# Patient Record
Sex: Female | Born: 1950
Health system: Southern US, Community
[De-identification: ages and names within clinical notes are randomized; demographics above are authoritative.]

## PROBLEM LIST (undated history)

## (undated) DIAGNOSIS — N189 Chronic kidney disease, unspecified: Secondary | ICD-10-CM

## (undated) DIAGNOSIS — R011 Cardiac murmur, unspecified: Secondary | ICD-10-CM

## (undated) DIAGNOSIS — R7303 Prediabetes: Secondary | ICD-10-CM

## (undated) DIAGNOSIS — F32A Depression, unspecified: Secondary | ICD-10-CM

## (undated) DIAGNOSIS — I10 Essential (primary) hypertension: Secondary | ICD-10-CM

## (undated) DIAGNOSIS — Z87442 Personal history of urinary calculi: Secondary | ICD-10-CM

## (undated) DIAGNOSIS — M1990 Unspecified osteoarthritis, unspecified site: Secondary | ICD-10-CM

## (undated) DIAGNOSIS — F419 Anxiety disorder, unspecified: Secondary | ICD-10-CM

## (undated) DIAGNOSIS — G4730 Sleep apnea, unspecified: Secondary | ICD-10-CM

## (undated) HISTORY — PX: OTHER SURGICAL HISTORY: SHX169

## (undated) HISTORY — PX: INGUINAL HERNIA REPAIR: SUR1180

## (undated) HISTORY — PX: LAPAROSCOPIC CHOLECYSTECTOMY: SUR755

---

## 2000-08-24 ENCOUNTER — Other Ambulatory Visit: Admission: RE | Admit: 2000-08-24 | Discharge: 2000-08-24 | Payer: Self-pay | Admitting: Family Medicine

## 2003-08-20 ENCOUNTER — Inpatient Hospital Stay (HOSPITAL_COMMUNITY): Admission: RE | Admit: 2003-08-20 | Discharge: 2003-08-24 | Payer: Self-pay | Admitting: Orthopedic Surgery

## 2006-09-21 ENCOUNTER — Ambulatory Visit (HOSPITAL_COMMUNITY): Admission: RE | Admit: 2006-09-21 | Discharge: 2006-09-21 | Payer: Self-pay | Admitting: Orthopedic Surgery

## 2008-08-01 IMAGING — CR DG CHEST 2V
2 series · 2 of 2 positions shown · non-contrast
Comparison: 08/10/03.

CLINICAL DATA: Preop for knee surgery. 
 CHEST - 2 VIEW:

[view not recorded (1 of 2)]
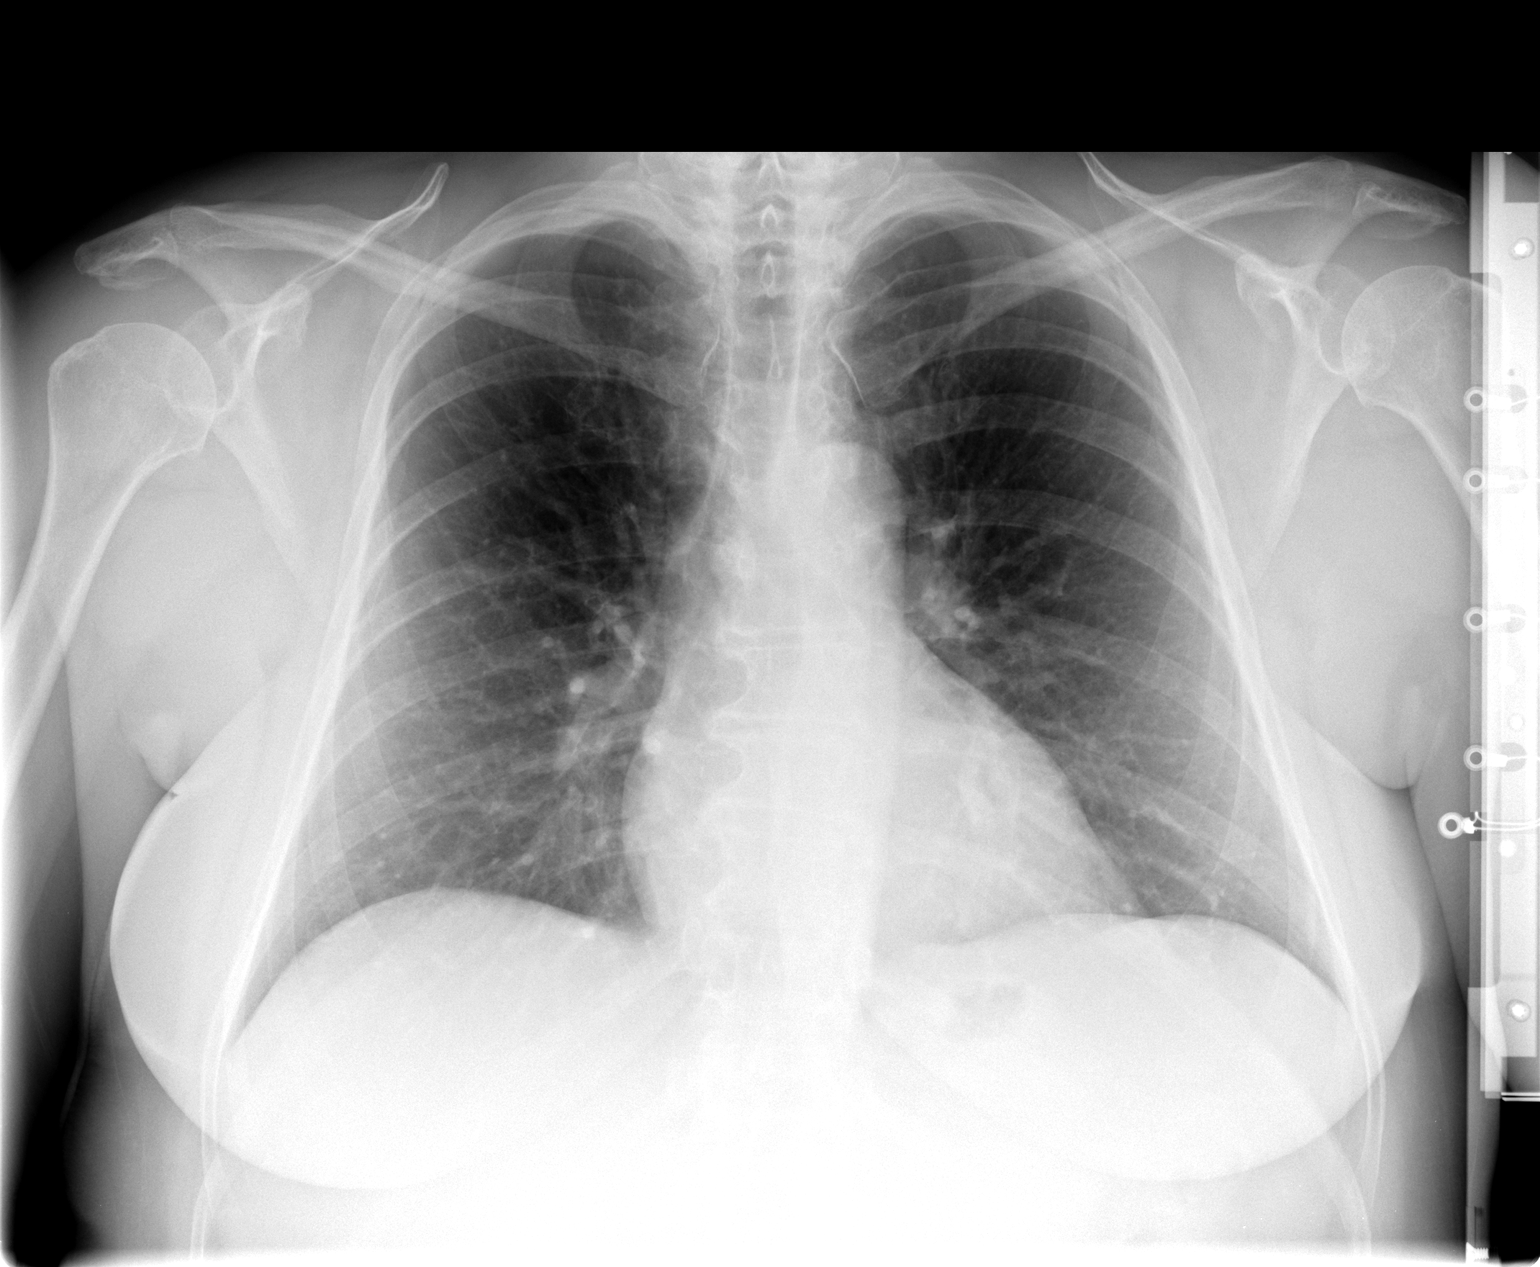

[view not recorded (2 of 2)]
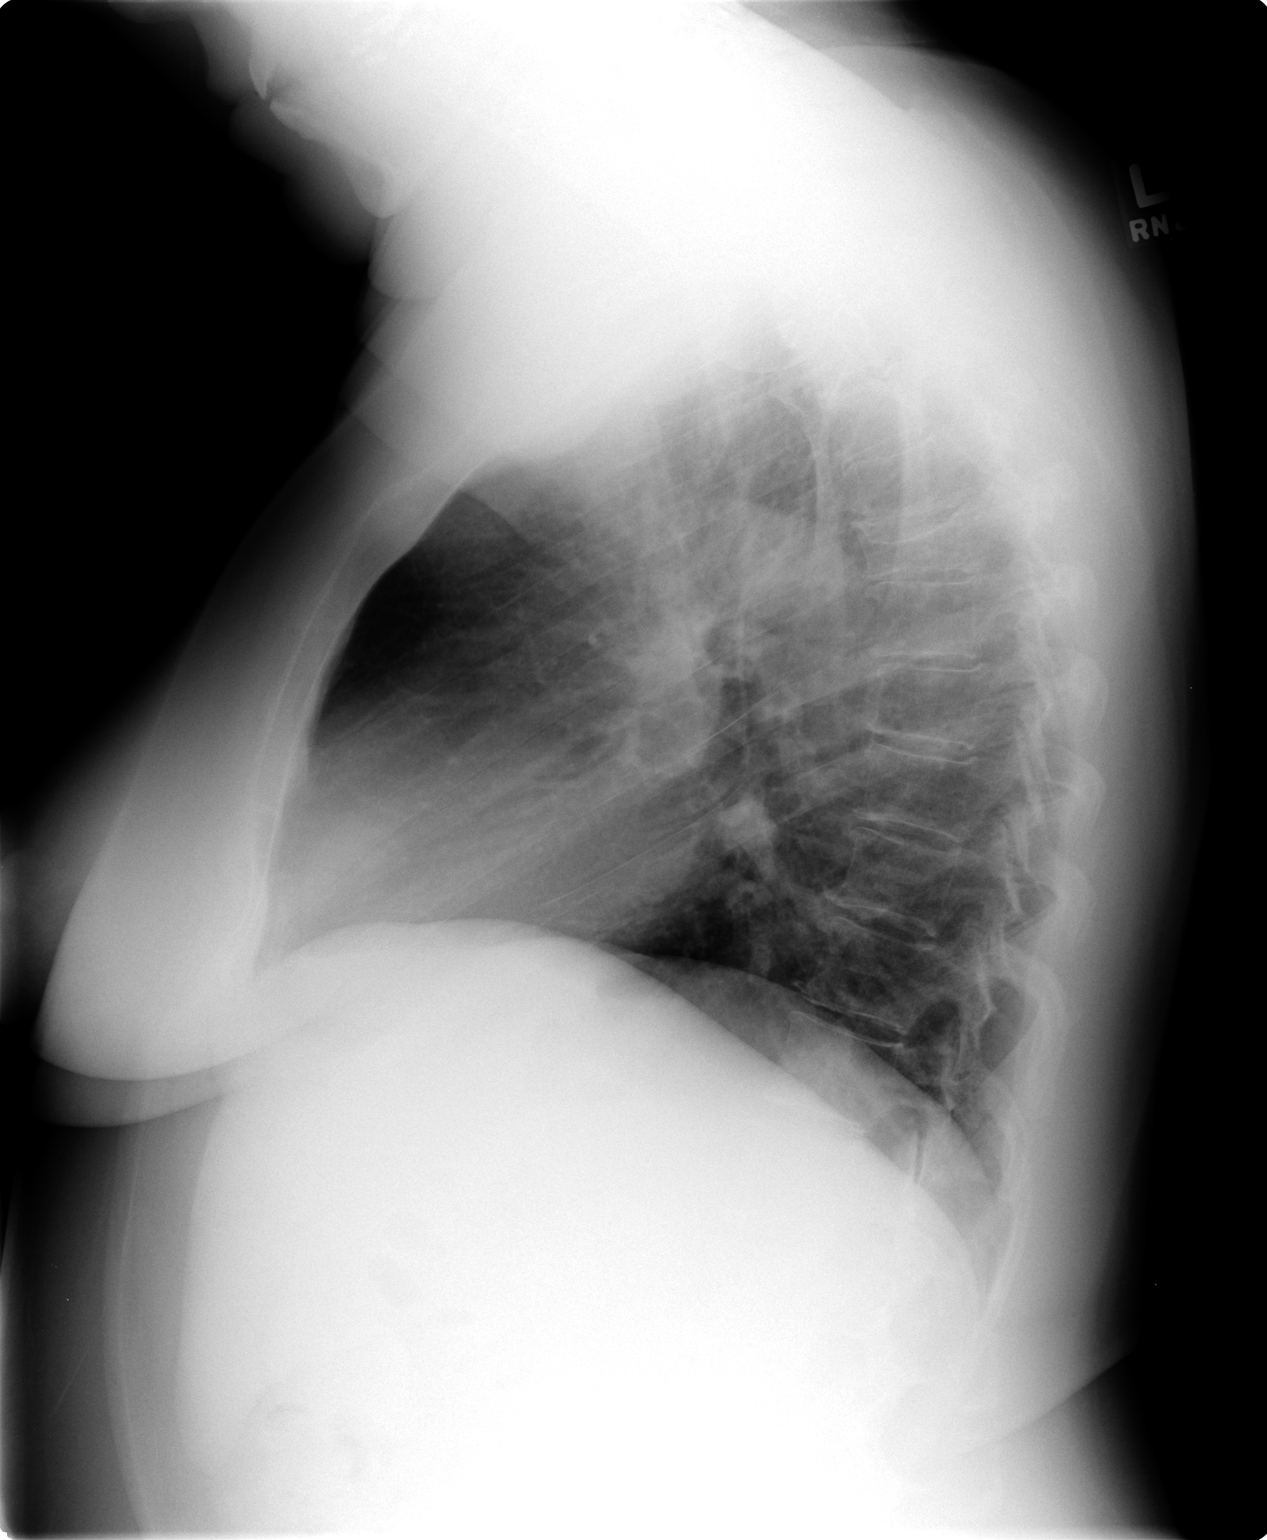

[2 of 2 positions shown; findings below may reference images not displayed]

FINDINGS: Heart size within normal limits.  Mild peribronchial thickening.  Lungs clear.  Stable T6 compression fracture.
IMPRESSION: 1.  No active cardiopulmonary disease.  
 2.   Old T6 compression fracture - stable.

## 2010-07-22 NOTE — Op Note (Signed)
NAME:  Kimberly Andrews, Kimberly Andrews NO.:  [id number]   MEDICAL RECORD NO.:  [id number]          PATIENT TYPE:  AMB   LOCATION:  DAY                          FACILITY:  [facility]   PHYSICIAN:  Marlowe Kays, M.D.  DATE OF BIRTH:  [birth year]   DATE OF PROCEDURE:  09/21/2006  DATE OF DISCHARGE:                               OPERATIVE REPORT   PREOPERATIVE DIAGNOSES:  1. Osteoarthritis.  2. Torn medial and lateral menisci right knee.   POSTOPERATIVE DIAGNOSES:  1. Osteoarthritis.  2. Torn medial and lateral menisci right knee.   OPERATION:  1. Right knee arthroscopy, with partial medial and lateral      meniscectomies.  2. Debridement of medial and lateral femoral condyles.   SURGEON:  Marlowe Kays, M.D.   ASSISTANT:  Nurse.   ANESTHESIA:  General.   PATHOLOGY AND INDICATIONS FOR PROCEDURE:  She has had a knee replacement  on the left.  Has tricompartmental degenerative changes which were not  felt to be severe on plain x-rays in the right knee, but an MRI has  demonstrated torn menisci.  Consequently, she is here today for the  above-mentioned arthroscopic procedure.   PROCEDURE:  Prophylactic antibiotics, satisfactory general anesthesia,  time-out performed.  Ace wrap and knee support to left lower extremity.  Thigh tourniquet to right leg, with the leg Esmarched out non sterilely.  Thigh stabilizer applied.  Right knee was prepped with DuraPrep from  stabilizer to ankle and draped in a sterile field.  Superomedial saline  inflow.  First through an anteromedial portal, the lateral compartment  of the knee joint was evaluated.  A good bit of synovitis was resected  with a 3.5 shaver.  The entire inner border of the lateral meniscus was  torn,  and there was also some partial detachment of articular cartilage  of the lateral femoral condyle.  I debrided down the lateral femoral  condyle and shaved down the inner portion of the lateral meniscus until  smooth with a  3.5 shaver.  Looking in the lateral gutter and  suprapatellar area, there was some wear of the patella, but nothing that  required shaving.  We then reversed portals, looking laterally in the  medial joint.  A good bit of synovitis once again I resected.  She had  grade 2-3/4 chondromalacia also of the medial femoral condyle, which I  shaved down until smooth.  Posteriorly, she had what initially looked  like a radial type tear of the intercondylar area of the medial  meniscus, but on further probing basically she had close to a bucket-  handle tear, necessitating removal of most of the entire posterior rim  of the medial meniscus back to the curve, with the remaining meniscus  being stable.  This was done with a variety of instruments.  Final  pictures were taken.  The knee was irrigated until clear.  The two  anterior portals were closed with 4-0 nylon.  I injected 20 cc of 0.5%  Marcaine with adrenalin and 4 mg of  morphine through the inflow apparatus, which I removed, and this portal  was closed  4-0 nylon as well.  Betadine, Adaptic, and dry sterile  dressing were applied.  Tourniquet was released.  She tolerated the  procedure well was taken to the recovery room in satisfactory condition,  with no known complications.           ______________________________  Marlowe Kays, M.D.     JA/MEDQ  D:  09/21/2006  T:  09/22/2006  Job:  [id number]

## 2010-07-25 NOTE — Op Note (Signed)
NAME:  Kimberly Andrews, Kimberly Andrews                       ACCOUNT NO.:  [id number]   MEDICAL RECORD NO.:  [id number]                   PATIENT TYPE:  INP   LOCATION:  0474                                 FACILITY:  [facility]   PHYSICIAN:  Marlowe Kays, M.D.               DATE OF BIRTH:  Aug 28, 1950   DATE OF PROCEDURE:  08/20/2003  DATE OF DISCHARGE:                                 OPERATIVE REPORT   PREOPERATIVE DIAGNOSIS:  Osteoarthritis, left knee.   POSTOPERATIVE DIAGNOSIS:  Osteoarthritis, left knee.   OPERATION:  Osteonics total knee replacement, left.   SURGEON:  Illene Labrador. [place], M.D.   ASSISTANT:  Worthy Rancher, M.D.   ANESTHESIA:  General.   PATHOLOGY AND JUSTIFICATION FOR PROCEDURE:  She had advanced patellofemoral  and medial compartment arthritis.   PROCEDURE:  Prophylactic antibiotics, satisfactory general anesthesia, Foley  catheter inserted, pneumatic tourniquet, lateral hip positioner, Surefoot.  Her left leg was prepped with DuraPrep from tourniquet to ankle and draped  in a sterile field.  Ioban employed.  The knee was esmarched out sterilely.  Vertical midline incision down to the patellar mechanism with median  parapatellar incision to open the joint.  The PCA __________ and medial  collateral ligament were undermined off the proximal tibia, and I then  released the posterior capsule all the way back medially.  Median  parapatellar incision was used to open the knee joint.  Osteophytes were  removed from around the femur and the patella.  Anterior portions of both  meniscus, ACL and PCL were sacrificed.  The patellar mechanism was freed up,  the patella everted, and the knee flexed.  A 516th inch drill hole was then  made in the distal femur followed by the canal finder and the axis liner set  at 5 degrees for the left knee.  A 10 mm distal femoral cut was made.  Incised the femur initially to 7 but subsequently found that a 5 was the  more appropriate size.  Scribe  lines were placed, and I used the distal  femoral cutting jig to make anterior and posterior cuts and posterior and  anterior chamferings.  Then went to the tibia, where I made a leveling cut  and measured it as a size 5.  Tibia base plate was placed as part of the  sizing.  I then made my initial drill hole into the proximal tibia which was  enlarged with the step-cut drill followed by the canal finder, then used  medullary rod and external cutting jig set for 0-degree posterior cut and a  4 mm cut off the medial tibial plateau.  After this cut was made, remnants  of the menisci were removed.  Then went back to the femur where I placed the  jig for making the patellar groove, followed by the slot for the post and  plate and then placed the lamina spreader to remove remnants of bone and  soft tissue posteriorly.  While the knee was in extension, I then sized the  patella to 26 and used the 10 mm recessed cutting jig to make the 10 mm  recess cut, followed by the guide for the 3 fixation holes for the patellar  button.  Trial button was placed, and excess bone was trimmed from around  the button.  We then went through a trial reduction with the components and  found the 10 or 12 mm spacer would be appropriate.  We used the external  rod, splitting the bimalleolar distance for the appropriate position of the  baseplate.  Then based on these markings, I attached the baseplate to the  proximal tibia with 3 pins, and we used the tripod apparatus to ream for the  tibial stem up to a 5 cemented.  The knee was then waterpicked while the  methyl methacrylate was being mixed.  The 3 components were then  individually glued in, starting first with the tibia which was tightly  impacted with excess methyl methacrylate being removed followed by the  femoral component in the same fashion with the 10 mm spacer, held the knee  in extension while we glued in the patella which we then held with the  patellar  holding clamp.  The wound was __________ hardened.  We removed  excess methyl methacrylate from around the components and used the 12 mm  posterior stabilized spacer which was the appropriate size for extension  which was to neutral and with good medial and lateral stability.  She also  had excellent flexion with stability.  Accordingly, we went ahead and placed  the final tibial spacer, 12 mm, and closed the wound over a Hemovac with  interrupted #1 Vicryl in two layers and the quadriceps mechanism and  distally in the synovium and capsule, 2-0 Vicryl in the subcutaneous tissue  and staples in the skin.  Betadine, Adaptic dry sterile dressing were  applied.  Tourniquet was released with a little less than 2 hours of  tourniquet time having lapsed.  She tolerated the procedure well and was  taken to the recovery room in satisfactory condition with no known  complications.  No blood loss, no blood replacement.                                               Marlowe Kays, M.D.    JA/MEDQ  D:  08/20/2003  T:  08/20/2003  Job:  [id number]

## 2010-07-25 NOTE — Discharge Summary (Signed)
NAME:  Kimberly Andrews, Kimberly Andrews                       ACCOUNT NO.:  [id number]   MEDICAL RECORD NO.:  [id number]                   PATIENT TYPE:  INP   LOCATION:  0474                                 FACILITY:  [facility]   PHYSICIAN:  Marlowe Kays, M.D.               DATE OF BIRTH:  [birth year]   DATE OF ADMISSION:  08/20/2003  DATE OF DISCHARGE:  08/24/2003                                 DISCHARGE SUMMARY   ADMISSION DIAGNOSES:  1. Osteoarthritis left knee.  2. Sleep apnea.  3. Hypertension.  4. Hyperlipidemia.   DISCHARGE DIAGNOSES:  1. Osteoarthritis left knee.  2. Sleep apnea.  3. Hypertension.  4. Hyperlipidemia.  5. Postoperative anemia.  6. Mild urinary tract infection.   OPERATION:  On Andrews 13, 2005 the patient underwent Osteonics total knee  replacement arthroplasty to the left knee with all three components  cemented, Dr. Ranee Gosselin assistant.   CONSULTATIONS:  None.   BRIEF HISTORY:  A 60 year old lady seen for [place] for continuing progressive  problems concerning bilateral knee pains.  The majority of her discomfort  was on the left.  She had been a very active lady who enjoyed activities  such as walking as well as her work.  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  Anti-inflammatories unfortunately did not help her and x-  rays had shown deterioration of the joints.  Bone-on-bone contact was seen  in the left knee on the medial aspect.  Felt the patient would benefit from  total knee replacement arthroplasty.  After all the complications of  surgery, pre and postoperative course was explained to the patient and she  agreed to go ahead with total knee replacement arthroplasty left knee.  She  was cleared preoperatively by Dr. Sudie Bailey for the surgery.   COURSE IN THE HOSPITAL:  The patient tolerated the surgical procedure quite  well.  She continued to have a positive  outlook concerning her postoperative  knee protocol and rehabilitation.  She tolerated CPM quite well achieving 50-  60 degrees flexion.  She had full extension.  Wound remained dry throughout  her hospitalization as well as her neurovascular status remaining intact to  the left lower extremity and the calf remained soft and repeated Hoffman  tests were negative.   The patient used her CPAP machine postoperatively, had no respiratory  complications.  She was placed on incentive spirometry used q.1 h. while  awake and for maintenance of lung volume and to prevent atelectasis.  The  patient ran a low-grade temperature throughout her hospitalization.  It was  not coming from the wound as it was clean, dry, and no drainage.  When the  Foley was discontinued, we did a urinalysis.  It was noted that she had  moderate leukocytes and a few white cells.  Empirically, we felt that she  did have a mild urinary tract infection.  We treated her with Septra DS as  an outpatient.   The day of discharge was eager to go home.  [facility] had been set  up and the patient was progressing so nicely it was felt she could be  maintained on home environments and arrangements made for discharge.   The patient was placed on Lovenox early on and then Coumadin for the  prevention of DVT postoperatively.   LABORATORY DATA:  Her hemoglobin and hematocrit dropped postoperatively  which is not uncommon to 9.8.  Preoperatively it was 14.  It was felt she  had a mild postoperative anemia which she was on Trinsicon t.i.d. as an iron  supplement.  She had no symptomatology concerning this anemia.  No  dizziness, no vertigo.  Laboratory values in the hospital hematologically  showed a CBC on admission which completely was within normal limits.  Final  hemoglobin was 9.8, hematocrit was 28.5.  Blood chemistries also were  normal.  Preoperative urinalysis showed a mild urinary tract infection as  well as the one  repeated on Andrews 16, 2005.  Chest x-ray showed no acute  chest disease.  A T6 compression fracture with 50% height lost anteriorly  was noted.  Knee x-ray showed mildly severe left knee tricompartmental  arthritis.  Electrocardiogram normal sinus rhythm with minimal voltage  criteria for LDH, maybe normal variant.   CONDITION ON DISCHARGE:  Improved, stable.   PLAN:  The patient is discharged to her home in the care of her family to  continue with her home rehabilitation program under the direction of  [place].  Return to see Dr. Simonne Come in 2 weeks after the date of surgery.  Continue with home medications and diet.  Use dry dressing to the knee  p.r.n., weight bear as tolerated, and ice p.r.n.  She may shower 4 days  after surgery.  She is given a prescription for Coumadin to be managed by  [facility] Pharmacist, Robaxin 500 mg as a muscle relaxant, Tylox for  discomfort, Trinsicon as an iron replacement, and Septra DS one b.i.d. x3  days for her mild urinary tract infection.  Told her at bedside she needs to  follow with Dr. Sudie Bailey should she have any urinary symptoms or if her  temperature continues.  Use her incentive spirometer at home and she is to  call the office should she have any problems or questions.     Kimberly Andrews.                 Marlowe Kays, M.D.    DLU/MEDQ  D:  08/24/2003  T:  08/25/2003  Job:  [id number]   cc:   Philemon Kingdom  [address]  [place]  [place] [address]  Fax: [phone]

## 2010-07-25 NOTE — H&P (Signed)
NAME:  Keturah, Yerby NO.:  [id number]   MEDICAL RECORD NO.:  [id number]                  PATIENT TYPE:   LOCATION:                                       FACILITY:   PHYSICIAN:  Marlowe Kays, M.D.               DATE OF BIRTH:  1951/02/08   DATE OF ADMISSION:  08/20/2003  DATE OF DISCHARGE:                                HISTORY & PHYSICAL   CHIEF COMPLAINT:  Pain in my left knee.   HISTORY OF PRESENT ILLNESS:  This is a 60 year old white female seen by Dr.  Simonne Come for bilateral knee pain, more so on the left than the right.  She  is referred here through the courtesy of Dr. Sudie Bailey in [place] at  [facility]. When seen in February of this by Dr. Simonne Come  they decided that with her findings she would benefit from a total knee  arthroplasty on the left.   X-rays have shown bone-on-bone contact with the left knee seen more so  medially. Patellar spurring is seen as well. She has pain with range of  motion on flexion and extension of the knee.  Due to these positive  findings, the fact that she is very active young lady, it is felt that she  would benefit from surgical intervention. She is being admitted for total  knee replacement arthroplasty of the left knee. She tells me that she will  be seen by Dr. Sudie Bailey in the next few days for preoperative clearance.   PAST MEDICAL HISTORY:  This lady has been in relatively good health  throughout her lifetime. Her only surgery has been an inguinal hernia repair  on the right in July 2002.  She does have hypertension and sleep apnea.   CURRENT MEDICATIONS:  1. Toprol XL 100 mg one daily.  2. Triamterene/HCTZ 75/50 one daily.  3. Diovan 320 mg one daily.  4. Lipitor 20 mg one daily.  5. Aspirin 81 mg daily (will stop before surgery).   ALLERGIES:  PENICILLIN which causes a rash.   Dr. Philemon Kingdom is her family physician in [place].   FAMILY HISTORY:  Positive for  heart disease. Her father is deceased. Mother  alive at 59. They also have hypertension. Diabetes in her grandmother. Lung  cancer in her aunt and stroke in another.   SOCIAL HISTORY:  The patient is married, she is a Midwife. She  has no intake of tobacco products. She has an occasional glass of wine. She  has four children. They live in a one-story house. Her husband and two adult  children will be her major caregivers after surgery.   REVIEW OF SYSTEMS:  CNS: No seizure disorder, paralysis, numbness, or double  vision. RESPIRATORY:  No productive cough, no hemoptysis, and no shortness  of breath. CARDIOVASCULAR: No chest pain, no angina, no orthopnea.  GASTROINTESTINAL: No nausea, vomiting, melena, or bloody stool.  GENITOURINARY: No  dysuria, hematuria, or discharge. MUSCULOSKELETAL:  Primarily in present illness.   PHYSICAL EXAMINATION:  VITAL SIGNS: Pulse 72, respirations 12, blood  pressure 138/82.  GENERAL: Alert, cooperative, and friendly 60 year old white female.  HEENT:  Normocephalic. PERRLA. Oropharynx is clear. EOMs are intact.  CHEST: Clear to auscultation. No rhonchi, no wheezes.  HEART: A grade 4/6 holosystolic murmur heard best at the right sternal  border.  ABDOMEN: Soft, nontender. Liver and spleen not felt.  RECTAL/BREASTS/GENITALIA: Not done; not pertinent to the present illness.  EXTREMITIES: The left knee as in present illness above.   ADMISSION DIAGNOSES:  1. Osteoarthritis, left knee.  2. Sleep apnea.  3. Hypertension.  4. Hyperlipidemia.   PLAN:  The patient will be admitted for total knee replacement arthroplasty  to the left knee. Dr. Simonne Come has discussed with her the possible  complications concerning this surgery.  In all probability, she will be able  to go home after regular hospitalization with [facility] supplying her home  health or she may have to go to [facility] depending on her level of  activity.     Dooley L. Cherlynn June.                 Marlowe Kays, M.D.    DLU/MEDQ  D:  08/02/2003  T:  08/02/2003  Job:  [id number]   cc:   Philemon Kingdom  [address]  [place]  [place] [address]  Fax: [phone]

## 2010-07-28 ENCOUNTER — Other Ambulatory Visit: Payer: Self-pay | Admitting: Orthopedic Surgery

## 2010-07-28 DIAGNOSIS — M549 Dorsalgia, unspecified: Secondary | ICD-10-CM

## 2010-07-28 DIAGNOSIS — M4310 Spondylolisthesis, site unspecified: Secondary | ICD-10-CM

## 2010-08-08 ENCOUNTER — Ambulatory Visit
Admission: RE | Admit: 2010-08-08 | Discharge: 2010-08-08 | Disposition: A | Discharge status: Home / Self Care | Payer: BC Managed Care – PPO | Source: Ambulatory Visit | Attending: Orthopedic Surgery | Admitting: Orthopedic Surgery

## 2010-08-08 DIAGNOSIS — M4310 Spondylolisthesis, site unspecified: Secondary | ICD-10-CM

## 2010-08-08 DIAGNOSIS — M549 Dorsalgia, unspecified: Secondary | ICD-10-CM

## 2010-09-03 ENCOUNTER — Other Ambulatory Visit: Payer: Self-pay | Admitting: Orthopedic Surgery

## 2010-09-03 ENCOUNTER — Other Ambulatory Visit (HOSPITAL_COMMUNITY): Payer: Self-pay | Admitting: Orthopedic Surgery

## 2010-09-03 ENCOUNTER — Encounter (HOSPITAL_COMMUNITY): Payer: BC Managed Care – PPO

## 2010-09-03 ENCOUNTER — Ambulatory Visit (HOSPITAL_COMMUNITY)
Admission: RE | Admit: 2010-09-03 | Discharge: 2010-09-03 | Disposition: A | Discharge status: Home / Self Care | Payer: BC Managed Care – PPO | Source: Ambulatory Visit | Attending: Orthopedic Surgery | Admitting: Orthopedic Surgery

## 2010-09-03 DIAGNOSIS — Z79899 Other long term (current) drug therapy: Secondary | ICD-10-CM | POA: Insufficient documentation

## 2010-09-03 DIAGNOSIS — G4733 Obstructive sleep apnea (adult) (pediatric): Secondary | ICD-10-CM | POA: Insufficient documentation

## 2010-09-03 DIAGNOSIS — M4800 Spinal stenosis, site unspecified: Secondary | ICD-10-CM

## 2010-09-03 DIAGNOSIS — I10 Essential (primary) hypertension: Secondary | ICD-10-CM | POA: Insufficient documentation

## 2010-09-03 DIAGNOSIS — Z01818 Encounter for other preprocedural examination: Secondary | ICD-10-CM | POA: Insufficient documentation

## 2010-09-03 DIAGNOSIS — M48061 Spinal stenosis, lumbar region without neurogenic claudication: Secondary | ICD-10-CM | POA: Insufficient documentation

## 2010-09-03 DIAGNOSIS — Z9089 Acquired absence of other organs: Secondary | ICD-10-CM | POA: Insufficient documentation

## 2010-09-03 DIAGNOSIS — Z01812 Encounter for preprocedural laboratory examination: Secondary | ICD-10-CM | POA: Insufficient documentation

## 2010-09-03 LAB — BASIC METABOLIC PANEL
BUN: 23 mg/dL (ref 6–23)
CO2: 31 mEq/L (ref 19–32)
Chloride: 99 mEq/L (ref 96–112)

## 2010-09-03 LAB — CBC
HCT: 41.1 % (ref 36.0–46.0)
Hemoglobin: 13.3 g/dL (ref 12.0–15.0)
MCH: 26.1 pg (ref 26.0–34.0)
MCV: 80.7 fL (ref 78.0–100.0)
Platelets: 272 10*3/uL (ref 150–400)

## 2010-09-03 LAB — SURGICAL PCR SCREEN: Staphylococcus aureus: NEGATIVE

## 2010-09-11 ENCOUNTER — Ambulatory Visit (HOSPITAL_COMMUNITY): Payer: BC Managed Care – PPO

## 2010-09-11 ENCOUNTER — Observation Stay (HOSPITAL_COMMUNITY)
Admission: RE | Admit: 2010-09-11 | Discharge: 2010-09-13 | DRG: 758 | Disposition: A | Discharge status: Home / Self Care | Payer: BC Managed Care – PPO | Source: Ambulatory Visit | Attending: Orthopedic Surgery | Admitting: Orthopedic Surgery

## 2010-09-11 DIAGNOSIS — M48061 Spinal stenosis, lumbar region without neurogenic claudication: Principal | ICD-10-CM | POA: Diagnosis present

## 2010-09-11 DIAGNOSIS — Z01812 Encounter for preprocedural laboratory examination: Secondary | ICD-10-CM

## 2010-09-11 DIAGNOSIS — G4733 Obstructive sleep apnea (adult) (pediatric): Secondary | ICD-10-CM | POA: Diagnosis present

## 2010-09-11 DIAGNOSIS — I10 Essential (primary) hypertension: Secondary | ICD-10-CM | POA: Diagnosis present

## 2010-09-11 DIAGNOSIS — Z79899 Other long term (current) drug therapy: Secondary | ICD-10-CM | POA: Insufficient documentation

## 2010-09-11 LAB — TYPE AND SCREEN: ABO/RH(D): A POS

## 2010-09-18 NOTE — Op Note (Signed)
  NAME:  Kimberly Andrews, Kimberly Andrews NO.:  [id number]  MEDICAL RECORD NO.:  [id number]  LOCATION:  1612                         FACILITY:  [facility]  PHYSICIAN:  Marlowe Kays, M.D.  DATE OF BIRTH:  03/14/1950  DATE OF PROCEDURE:  09/11/2010 DATE OF DISCHARGE:                              OPERATIVE REPORT   PREOPERATIVE DIAGNOSES:  Spinal stenoses at L2-3, L3-4, and L4-5.  POSTOPERATIVE DIAGNOSES:  Spinal stenoses at L2-3, L3-4, and L4-5.  OPERATION:  Decompressive laminectomy at L2-3, L3-4, and L4-5.  SURGEON:  Marlowe Kays, M.D.  ASSISTANT:  Georges Lynch. Darrelyn Hillock, M.D.  ANESTHESIA:  General.  PLAN/JUSTIFICATION FOR PROCEDURE:  She is having back and bilateral leg pain, right greater than left, with a myelogram CT scan demonstrating three-level spinal stenosis most severe at L4-5.  The MRI demonstrated no disk herniation.  PROCEDURE:  Prophylactic antibiotics, prone position on the Wilson frame after Foley catheter inserted.  Back was prepped with DuraPrep, draped in sterile field.  Ioban employed.  Time-out performed.  I made a midline incision and dissected off enough of the soft tissue to identify several spinous processes which I tagged with Kocher clamps and found that they were at the L2 and L3.  Accordingly I extended the incision distally and soft tissue was dissected off the neural arches from L2 to L5 and 2 self-retaining McCullough retractors were placed.  When bleeding had been controlled, I then used double-action rongeur first and then 2 and 3 mm Kerrison rongeurs to begin removing bone and ligamentum flavum.  Earlier on we brought in the microscope to do most of the fine decompression work using the microscope.  When the decompression had been completed, we checked to make sure with the hockey-stick that all nerve roots were well decompressed.  The wound was irrigated with sterile saline.  Self-retaining McCullough retractors were removed.  There was  minimal bleeding.  I then closed the paralumbar muscle and fascia with interrupted #1 Vicryl, leaving a 2-cm aperture distally for egress of any accumulated blood. Subcutaneous tissue was closed with combination of #1 and 0  Vicryl, staples on the skin.  Betadine, Adaptic, dry sterile dressing were applied.  She tolerated the procedure well, was taken to the recovery room in satisfactory condition with no known complication.  Estimated blood loss was 500 cc and no blood replacement..          ______________________________ Marlowe Kays, M.D.     JA/MEDQ  D:  09/11/2010  T:  09/11/2010  Job:  [id number]  Electronically Signed by Marlowe Kays M.D. on 09/18/2010 12:18:29 PM

## 2010-09-25 NOTE — Discharge Summary (Signed)
  NAME:  Kimberly Andrews, Kimberly Andrews NO.:  [id number]  MEDICAL RECORD NO.:  [id number]  LOCATION:  1612                         FACILITY:  [facility]  PHYSICIAN:  Marlowe Kays, M.D.  DATE OF BIRTH:  1950/10/29  DATE OF ADMISSION:  09/11/2010 DATE OF DISCHARGE:  09/13/2010                              DISCHARGE SUMMARY   ADMITTING DIAGNOSIS:  Spinal stenosis, L2-L3, L3-L4, and L4-L5.  DISCHARGE DIAGNOSIS:  Spinal stenosis, L2-L3, L3-L4, and L4-L5.  OPERATION:  On September 11, 2010, the patient underwent decompressive lumbar laminectomy at L2-L3, L3-L4, and L4-L5, Dr. Ranee Gosselin assisted.  BRIEF HISTORY:  This lady had continuing progressive problems concerning pain in her low back with bilateral leg pain.  She had more pain on the right than the left.  She had no trauma but had continuing pain, which began to interfere with her day-to-day activities.  We eventually ordered a myelogram CT scan and a three-level spinal stenosis was seen, but most severe at L4-L5.  MRI also done showed no disk herniation, so she primarily had stenosis.  After much conversation including risks and benefits of surgery, it was decided to go ahead with the above procedure.  COURSE IN HOSPITAL:  The patient tolerated the procedure quite well and had essentially complete resolution of her preoperative symptoms.  She did have soreness in the back which was expected.  She was seen by physical therapy for visit but she was ambulating in room, going out of bed to the bathroom and voiding well and had little to no back discomfort.  When seen on the weekend by one of our partners, she was anxious to go home.  She had all of her prescriptions done and she was sent home with dressings.  Wound was clean and dry at the time of discharge.  Neurovascularly intact in the lower extremities.  She was discharged home on Tylenol if she needed it, Percocet for discomfort, Robaxin as a muscle relaxant.  To continue  with her usual multivitamins, vitamin E, Metamucil, enteric-coated aspirin daily, alprazolam 0.25 mg daily, hydrocodone p.r.n. if Percocet too much, Atorvastatin 20 mg tablets at bedtime, Diovan 320 mg tab every morning, metoprolol XL succinate 50 mg every morning, hydrochlorothiazide 50 mg dose 1 every morning, hydralazine 100 mg dose 1 tablet twice a day.  Return to see [place] in the office 2 weeks after date of surgery.  Continue with her home diet.  She is encouraged to call should she have any problems or questions.    Dooley L. Cherlynn June.   ______________________________ Marlowe Kays, M.D.   DLU/MEDQ  D:  09/18/2010  T:  09/18/2010  Job:  [id number]  Electronically Signed by Marlowe Kays M.D. on 09/25/2010 12:58:08 PM

## 2010-12-22 LAB — BASIC METABOLIC PANEL
CO2: 26
Creatinine, Ser: 0.88
GFR calc Af Amer: >60
GFR calc non Af Amer: >60
Glucose, Bld: 104 — ABNORMAL HIGH
Potassium: 3.7
Sodium: 138

## 2010-12-22 LAB — APTT: aPTT: 31

## 2010-12-22 LAB — HEMOGLOBIN AND HEMATOCRIT, BLOOD
HCT: 40.4
Hemoglobin: 13.8

## 2012-06-18 IMAGING — RF DG MYELOGRAM LUMBAR
13 of 14 series · 13 of 14 positions shown · IV contrast (omnipaque)
Comparison: Previous MRI

CLINICAL DATA: Pain radiating down the right leg
TECHNIQUE: Informed consent was obtained from the patient prior to
the procedure, including potential complications of headache,
allergy, infection and pain.  A timeout procedure was performed.
With the patient prone, the lower back was prepped with Betadine.
1% Lidocaine was used for local anesthesia.  Lumbar puncture was
performed at the right L5-S1 level using a 22 gauge needle with
return of clear CSF.  13 ml of Omnipaque 323was injected into the
subarachnoid space .
TECHNIQUE: Following injection of intrathecal Omnipaque contrast,
spine imaging in multiple projections was performed using
fluoroscopy.

Fluoroscopy Time: 1 minute 13 seconds .
TECHNIQUE: Multidetector CT imaging of the lumbar spine was
performed without intravenous contrast administration.  Multiplanar
CT image reconstructions were also generated.

[Series 1: (hospital) · 1 of 1 slices shown]
[im 1/1]
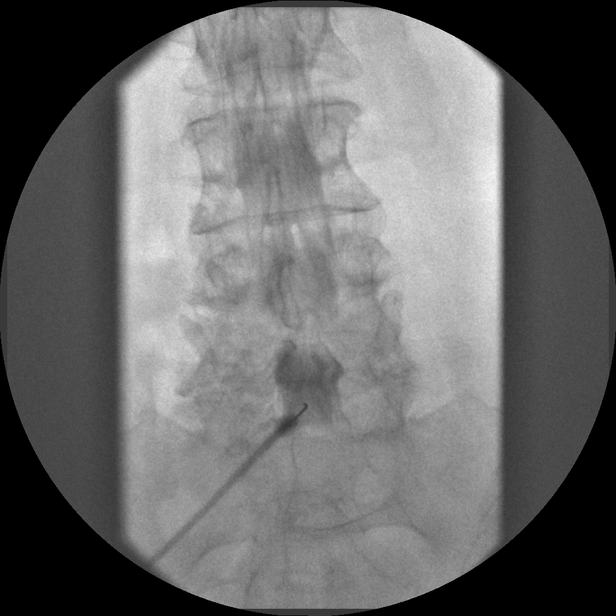

[Series 2: myelogram  white · 1 of 1 slices shown (1 of 12)]
[im 1/1]
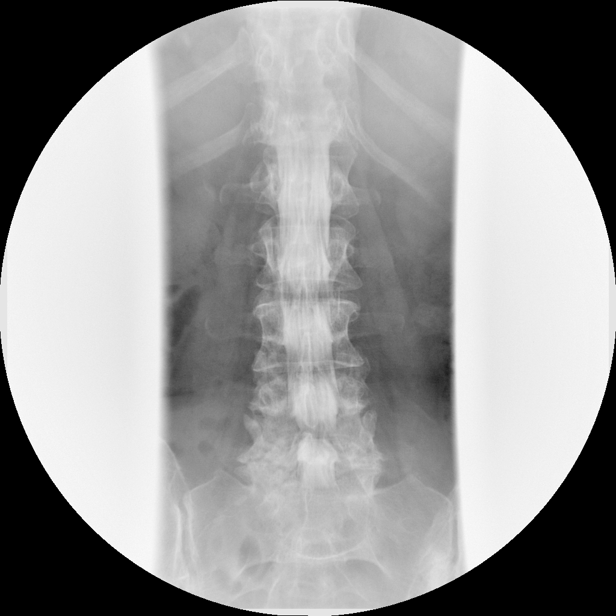

[Series 3: myelogram  white · 1 of 1 slices shown (2 of 12)]
[im 1/1]
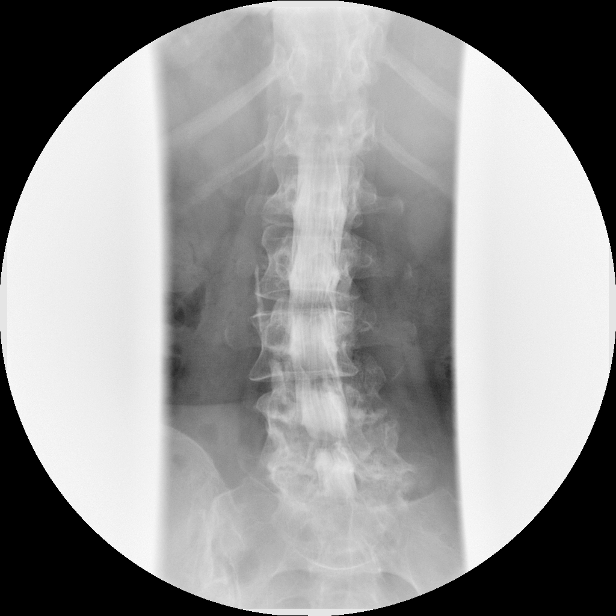

[Series 4: myelogram  white · 1 of 1 slices shown (3 of 12)]
[im 1/1]
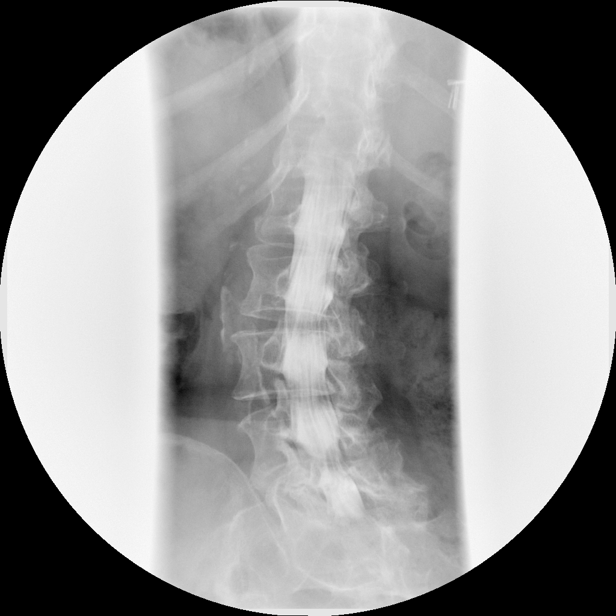

[Series 5: myelogram  white · 1 of 1 slices shown (4 of 12)]
[im 1/1]
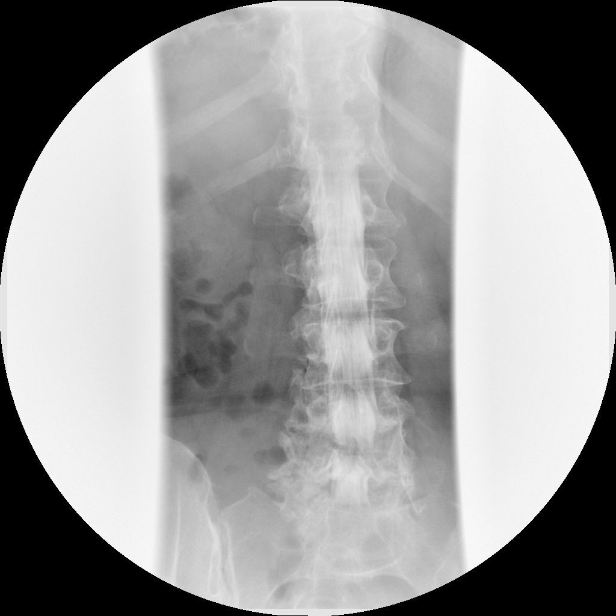

[Series 6: myelogram  white · 1 of 1 slices shown (5 of 12)]
[im 1/1]
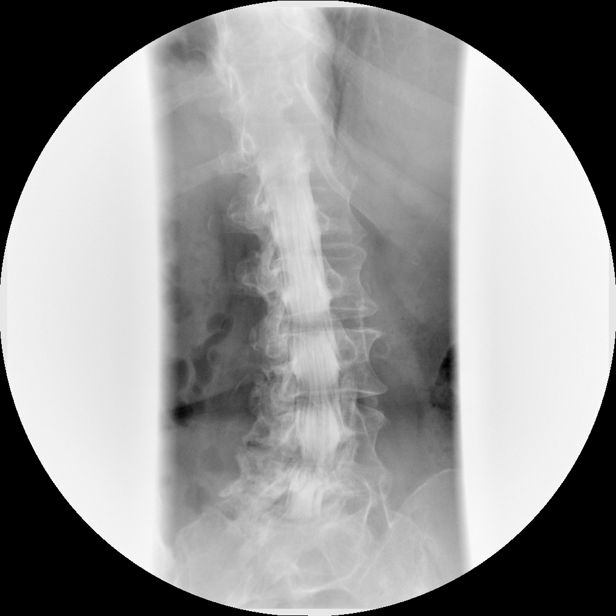

[Series 8: myelogram  white · 1 of 1 slices shown (6 of 12)]
[im 1/1]
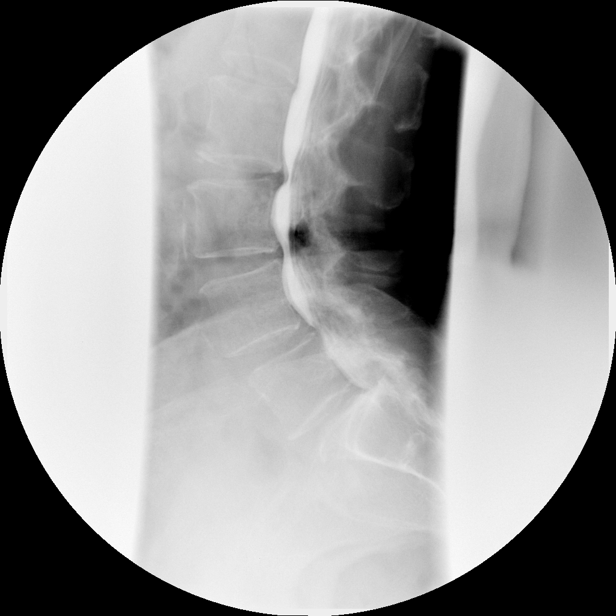

[Series 10: myelogram  white · 1 of 1 slices shown (7 of 12)]
[im 1/1]
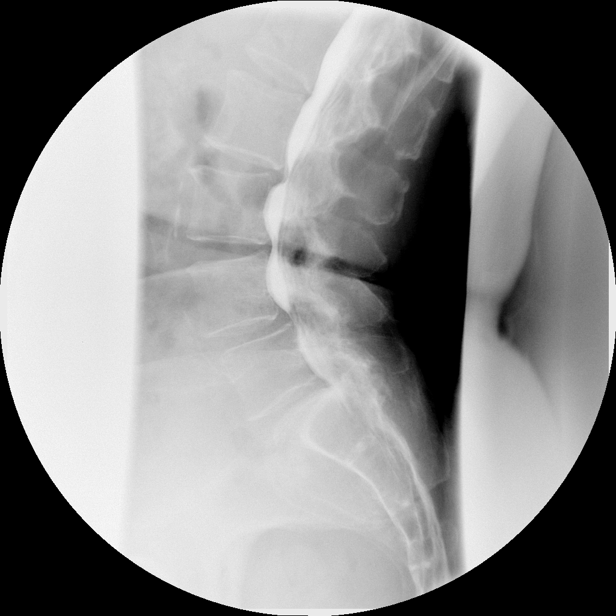

[Series 11: myelogram  white · 1 of 1 slices shown (8 of 12)]
[im 1/1]
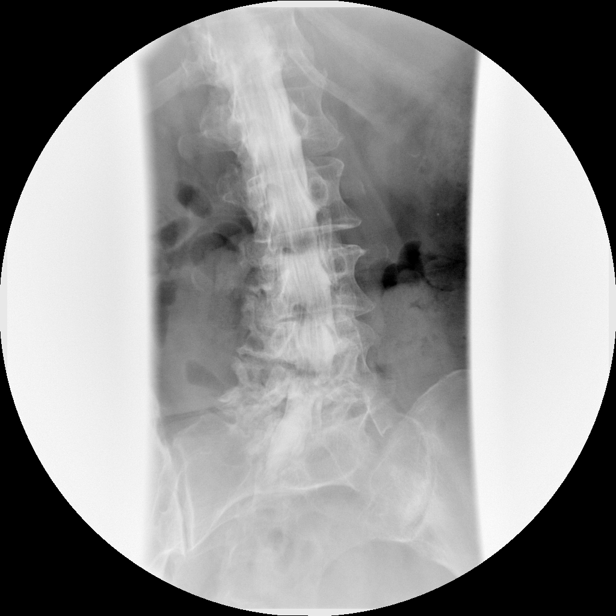

[Series 12: myelogram  white · 1 of 1 slices shown (9 of 12)]
[im 1/1]
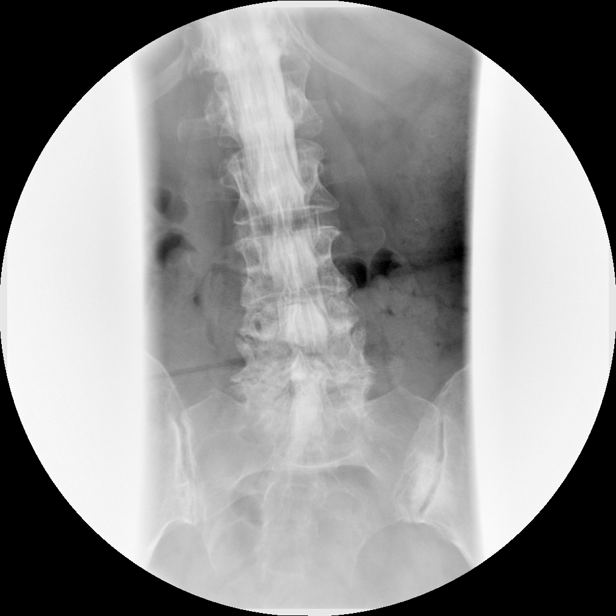

[Series 13: myelogram  white · 1 of 1 slices shown (10 of 12)]
[im 1/1]
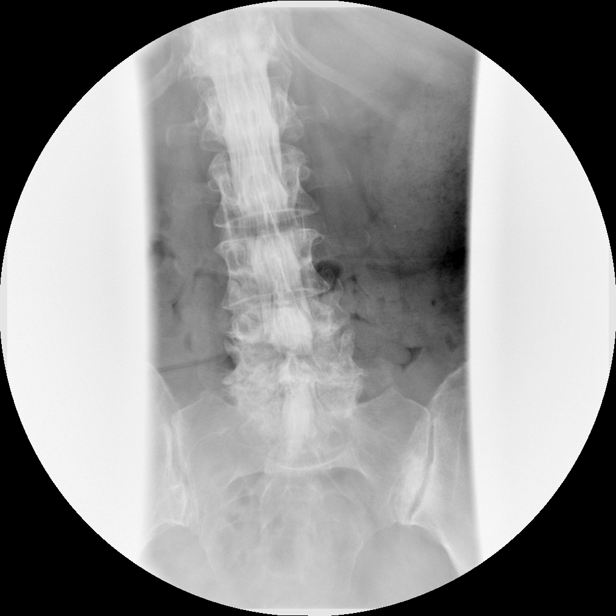

[Series 14: myelogram  white · 1 of 1 slices shown (11 of 12)]
[im 1/1]
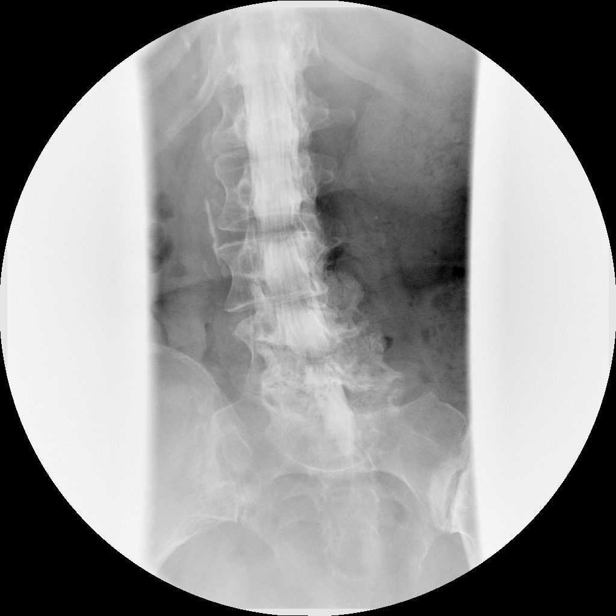

[Series 15: myelogram  white · 1 of 1 slices shown (12 of 12)]
[im 1/1]
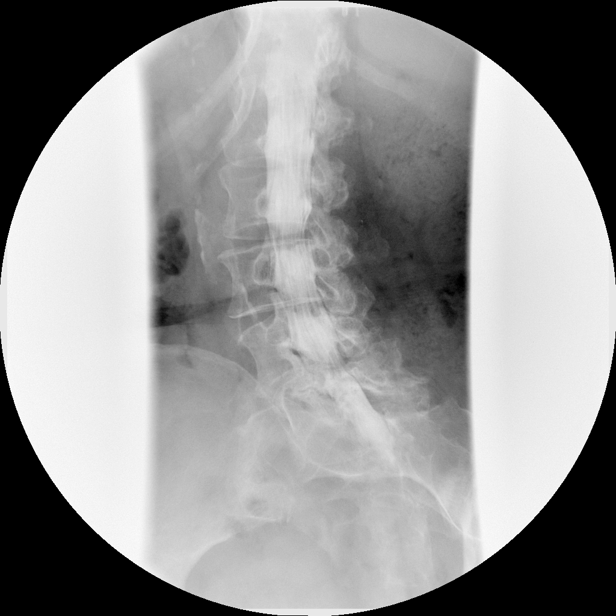

[13 of 14 positions shown; findings below may reference images not displayed]

IMPRESSION: Successful injection of  intrathecal contrast for myelography.

MYELOGRAM LUMBAR
FINDINGS: L1-2:  Minimal anterior extradural defect.  No neural
compression.

L2-3:  Moderate anterior extradural defect.  Mild narrowing of the
lateral recesses.

L3-4:  Moderate anterior extradural defect.  Mild narrowing of the
lateral recesses.

L4-5:  Anterolisthesis of 4 mm that increases to 7 mm with
standing.  No further change with flexion or extension.
Multifactorial spinal stenosis with narrowing of both lateral
recesses.  Neural compression could occur on either or both sides.

L5-S1:  Anterolisthesis of 2 mm that increases to 4 mm with
standing.  No further change or flexion or extension.  Mild
narrowing of both lateral recesses.
IMPRESSION: Degenerative anterolisthesis at L4-5 of 4 mm, increasing to 7 mm
with standing.  Stenosis of the lateral recesses that could cause
neural compression on either or both sides.

Degenerative anterolisthesis of L5-S1 of 2 mm, increasing to 4 mm
with standing.  Mild narrowing of both lateral recesses.

Non compressive disc bulges at L1-2, L2-3 and L3-4.

CT LUMBAR SPINE WITHOUT CONTRAST
FINDINGS: T12-L1:  Normal interspace.  Conus tip at lower L1.

L1-2:  Minimal bulging of the disc.  No neural compression.

L2-3:  Moderate circumferential bulging of the disc.  Mild facet
and ligamentous hypertrophy.  No gross neural compression.

L3-4:  Circumferential bulging of the disc.  Facet degeneration and
ligamentous hypertrophy.  Mild moderate narrowing of both lateral
recesses.

L4-5:  Anterolisthesis of 5 mm.  Circumferential bulging of the
disc with extension into both neural foramina.  Bilateral facet
arthropathy with hypertrophic change.  Moderate multifactorial
stenosis, particularly effecting the lateral recesses and foramina,
actually more pronounced on the left than the right.  Neural
compression could occur on either or both sides.

L5-S1:  Anterolisthesis of 3 mm.  Mild bulging of the disc.
Bilateral facet arthropathy.  Mild narrowing of the lateral
recesses and neural foramina without definite neural compression.
IMPRESSION: The dominant findings at the L4-5 level.  There is advanced
bilateral facet arthropathy with anterolisthesis of 5 mm.  This is
known to worsen with standing.  Protrusion of disc material with
foraminal extension left more than right.  Stenosis of both lateral
recesses and neural foramina, left more than right.  Certainly,
neural compression could occur on either or both sides.

L5-S1:  Bilateral facet arthropathy.  3 mm of anterolisthesis.
Mild narrowing of the lateral recesses and foramina without
definite neural compression.

L3-4:  Bulging of the disc.  Bilateral facet degeneration and
ligamentous hypertrophy.  Mild moderate narrowing of both lateral
recesses.

L2-3:  Bulging of the disc.  Mild facet and ligamentous prominence.
Mild narrowing of both lateral recesses.

## 2012-06-18 IMAGING — CT CT L SPINE W/ CM
4 of 9 series · 13 of 33 positions shown, 15 images · IV contrast (omnipaque)
Comparison: Previous MRI

CLINICAL DATA: Pain radiating down the right leg
TECHNIQUE: Informed consent was obtained from the patient prior to
the procedure, including potential complications of headache,
allergy, infection and pain.  A timeout procedure was performed.
With the patient prone, the lower back was prepped with Betadine.
1% Lidocaine was used for local anesthesia.  Lumbar puncture was
performed at the right L5-S1 level using a 22 gauge needle with
return of clear CSF.  13 ml of Omnipaque 323was injected into the
subarachnoid space .
TECHNIQUE: Following injection of intrathecal Omnipaque contrast,
spine imaging in multiple projections was performed using
fluoroscopy.

Fluoroscopy Time: 1 minute 13 seconds .
TECHNIQUE: Multidetector CT imaging of the lumbar spine was
performed without intravenous contrast administration.  Multiplanar
CT image reconstructions were also generated.

[Series 2: l spine bone · axial · 0.27mm/px · z∈[-121,-53]mm · 2 of 82 slices shown, 3 images]
[im 28/82  soft-tissue]
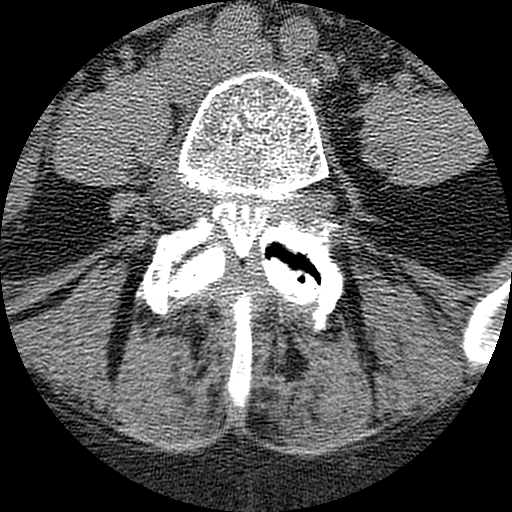
[im 28/82  bone]
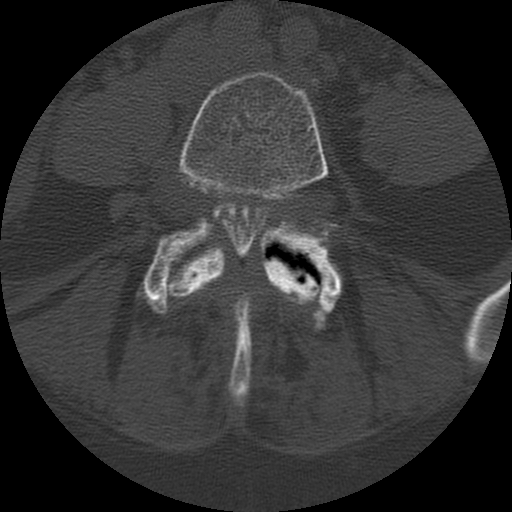
[im 55/82  bone]
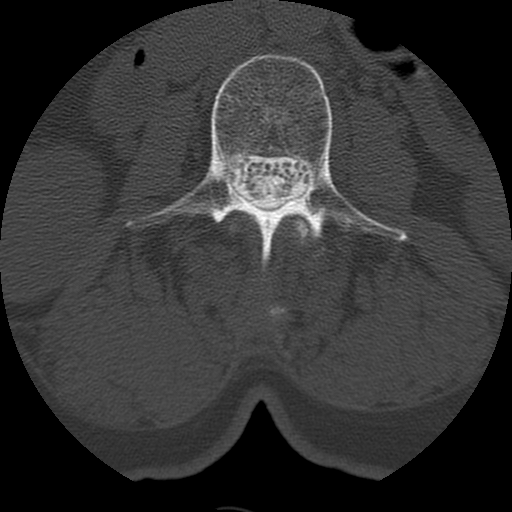

[Series 3: l spine soft · axial · 0.27mm/px · z∈[-138,-36]mm · 3 of 81 slices shown]
[im 21/81  soft-tissue]
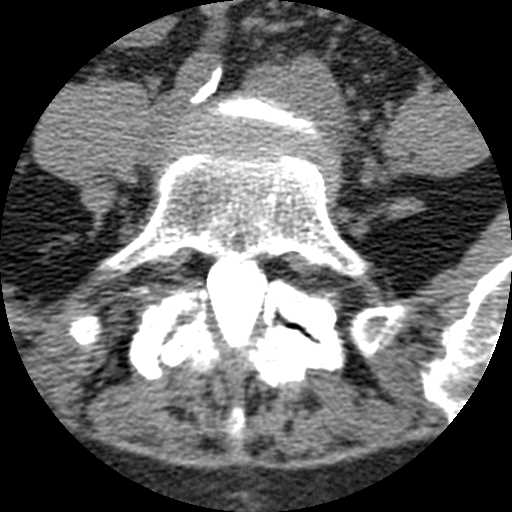
[im 41/81  soft-tissue]
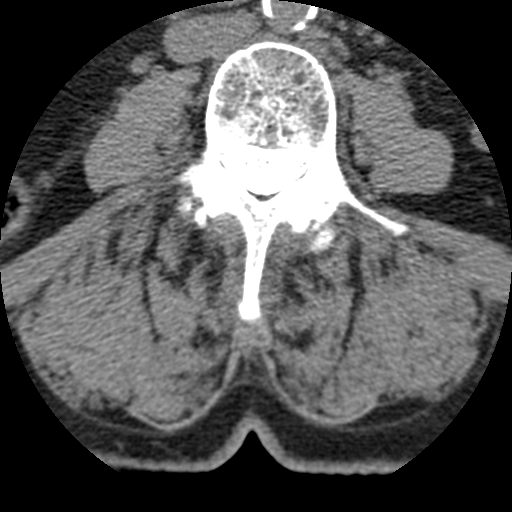
[im 61/81  soft-tissue]
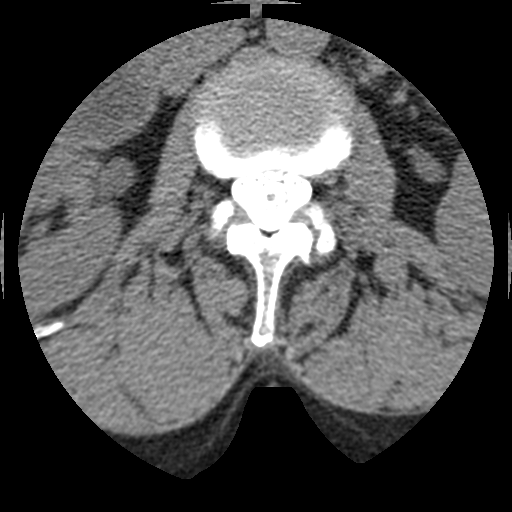

[Series 400: cor upper · coronal · 0.41mm/px · 3 of 43 slices shown]
[im 9/43  bone]
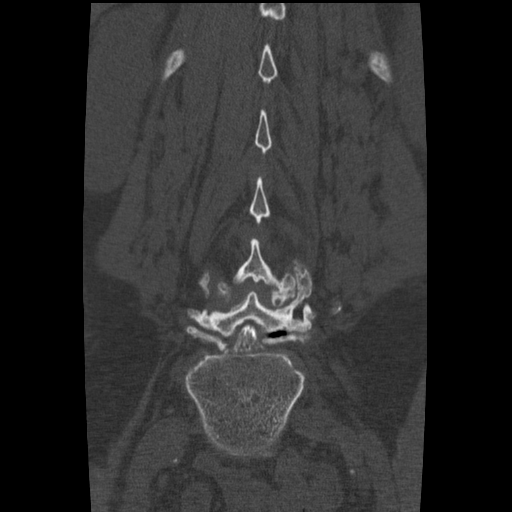
[im 17/43  bone]
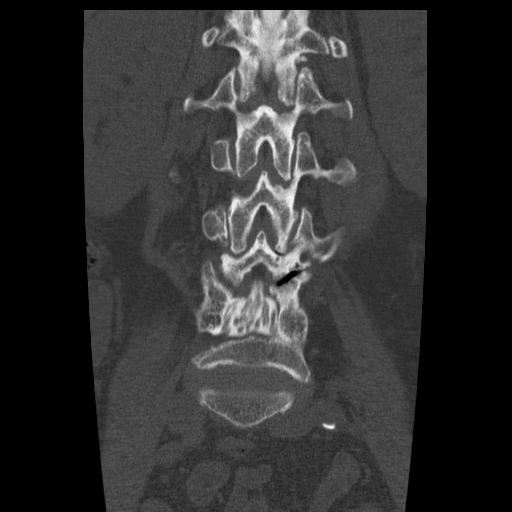
[im 26/43  bone]
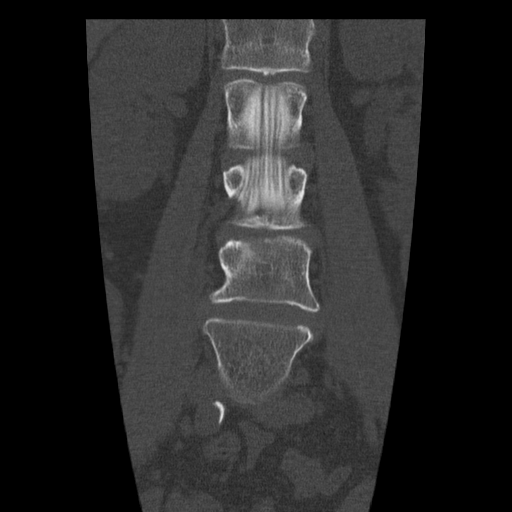

[Series 402: sag · sagittal · 0.41mm/px · 5 of 43 slices shown, 6 images]
[im 15/43  bone]
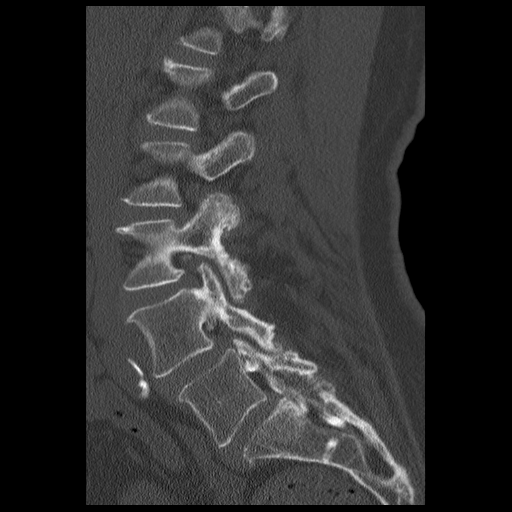
[im 18/43  bone]
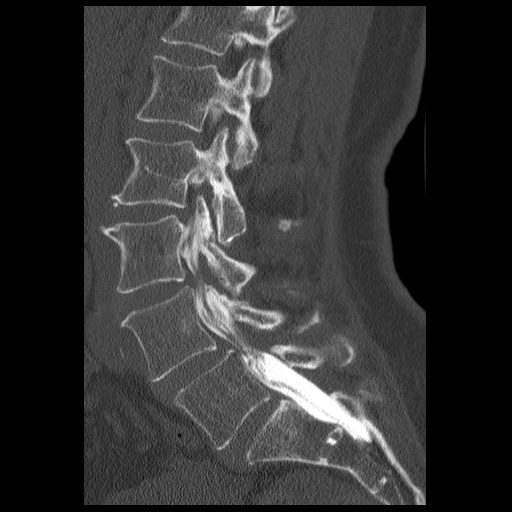
[im 22/43  soft-tissue]
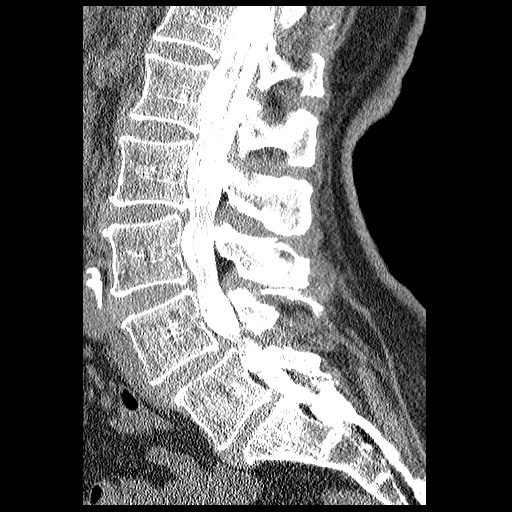
[im 22/43  bone]
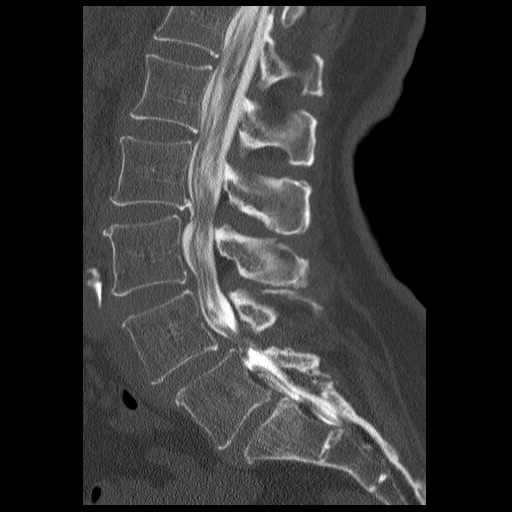
[im 25/43  bone]
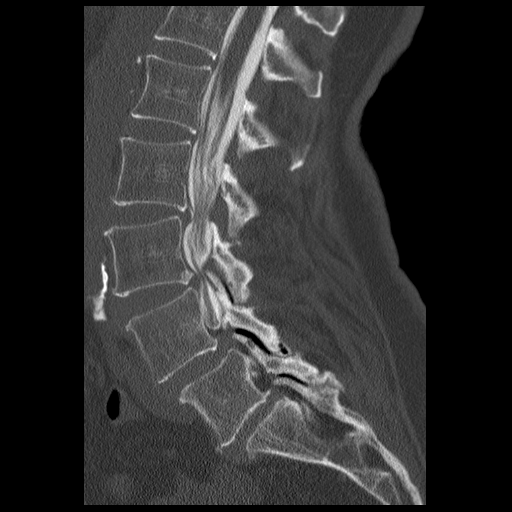
[im 29/43  bone]
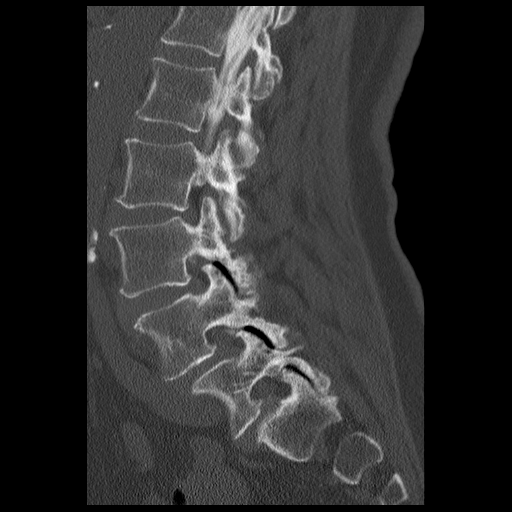

[13 of 33 positions shown; findings below may reference images not displayed]

IMPRESSION: Successful injection of  intrathecal contrast for myelography.

MYELOGRAM LUMBAR
FINDINGS: L1-2:  Minimal anterior extradural defect.  No neural
compression.

L2-3:  Moderate anterior extradural defect.  Mild narrowing of the
lateral recesses.

L3-4:  Moderate anterior extradural defect.  Mild narrowing of the
lateral recesses.

L4-5:  Anterolisthesis of 4 mm that increases to 7 mm with
standing.  No further change with flexion or extension.
Multifactorial spinal stenosis with narrowing of both lateral
recesses.  Neural compression could occur on either or both sides.

L5-S1:  Anterolisthesis of 2 mm that increases to 4 mm with
standing.  No further change or flexion or extension.  Mild
narrowing of both lateral recesses.
IMPRESSION: Degenerative anterolisthesis at L4-5 of 4 mm, increasing to 7 mm
with standing.  Stenosis of the lateral recesses that could cause
neural compression on either or both sides.

Degenerative anterolisthesis of L5-S1 of 2 mm, increasing to 4 mm
with standing.  Mild narrowing of both lateral recesses.

Non compressive disc bulges at L1-2, L2-3 and L3-4.

CT LUMBAR SPINE WITHOUT CONTRAST
FINDINGS: T12-L1:  Normal interspace.  Conus tip at lower L1.

L1-2:  Minimal bulging of the disc.  No neural compression.

L2-3:  Moderate circumferential bulging of the disc.  Mild facet
and ligamentous hypertrophy.  No gross neural compression.

L3-4:  Circumferential bulging of the disc.  Facet degeneration and
ligamentous hypertrophy.  Mild moderate narrowing of both lateral
recesses.

L4-5:  Anterolisthesis of 5 mm.  Circumferential bulging of the
disc with extension into both neural foramina.  Bilateral facet
arthropathy with hypertrophic change.  Moderate multifactorial
stenosis, particularly effecting the lateral recesses and foramina,
actually more pronounced on the left than the right.  Neural
compression could occur on either or both sides.

L5-S1:  Anterolisthesis of 3 mm.  Mild bulging of the disc.
Bilateral facet arthropathy.  Mild narrowing of the lateral
recesses and neural foramina without definite neural compression.
IMPRESSION: The dominant findings at the L4-5 level.  There is advanced
bilateral facet arthropathy with anterolisthesis of 5 mm.  This is
known to worsen with standing.  Protrusion of disc material with
foraminal extension left more than right.  Stenosis of both lateral
recesses and neural foramina, left more than right.  Certainly,
neural compression could occur on either or both sides.

L5-S1:  Bilateral facet arthropathy.  3 mm of anterolisthesis.
Mild narrowing of the lateral recesses and foramina without
definite neural compression.

L3-4:  Bulging of the disc.  Bilateral facet degeneration and
ligamentous hypertrophy.  Mild moderate narrowing of both lateral
recesses.

L2-3:  Bulging of the disc.  Mild facet and ligamentous prominence.
Mild narrowing of both lateral recesses.

## 2012-07-22 IMAGING — CR DG SPINE 1V PORT
1 series · 1 of 1 positions shown · non-contrast
Comparison: August 08, 2010

CLINICAL DATA: L2-L5 laminectomy

CERVICAL SPINE - 1 VIEW

[lateral]
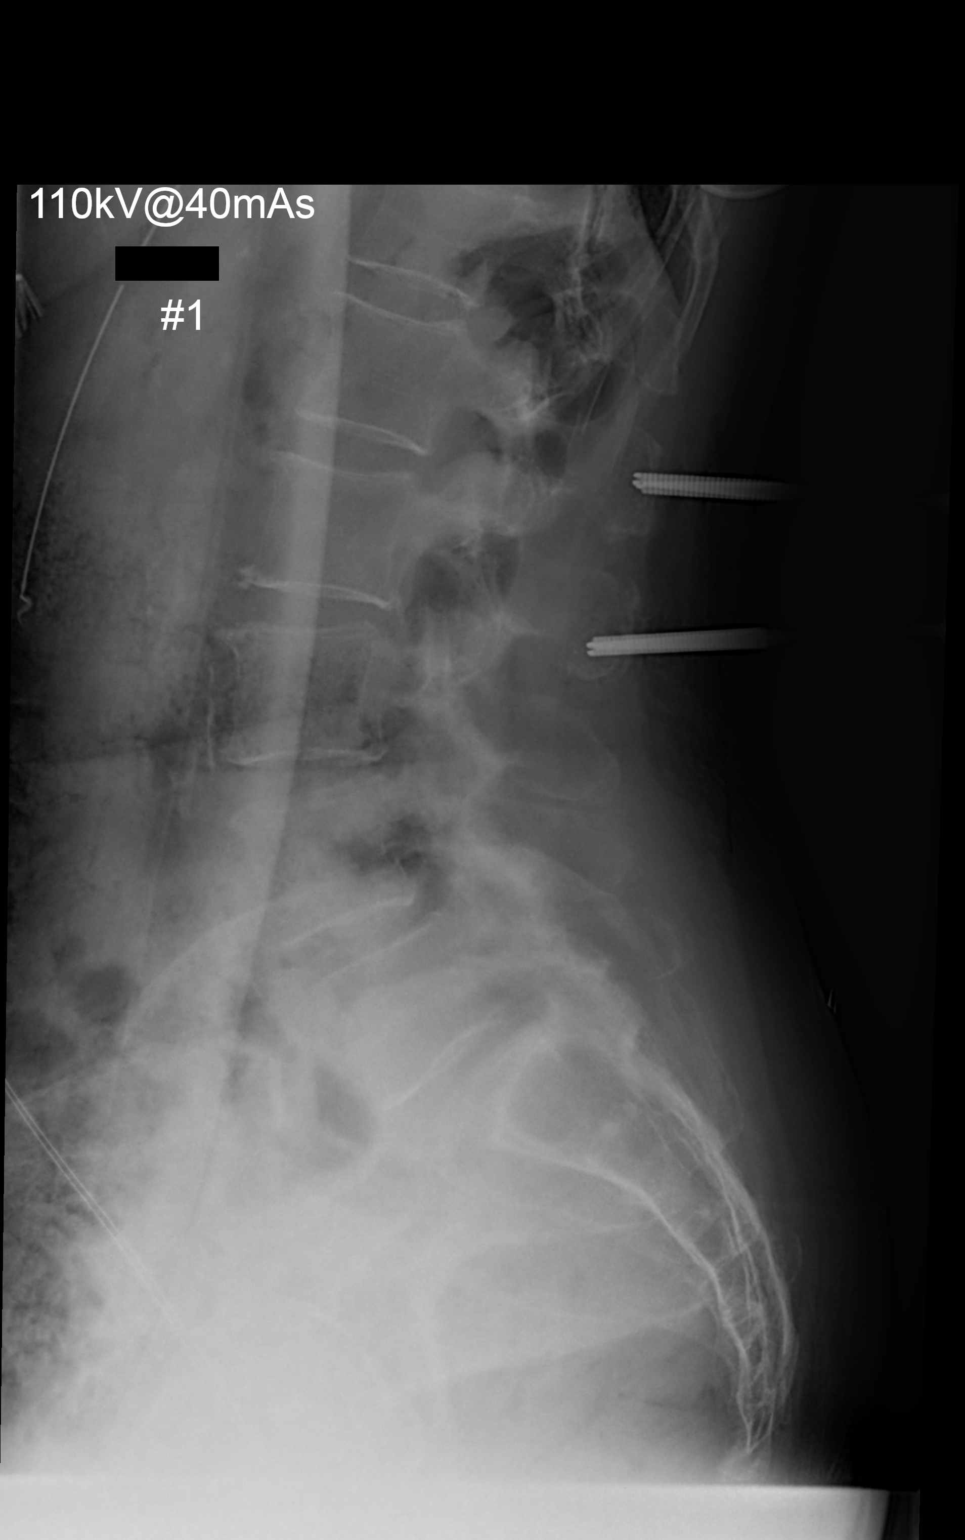

[1 of 1 positions shown; findings below may reference images not displayed]

FINDINGS: The posterior surgical instruments are at the L1-L2 and
L2-L3 levels.  The lateral alignment is maintained.
IMPRESSION: Lateral Intraop lumbar spine for localization.

## 2015-03-21 DIAGNOSIS — N390 Urinary tract infection, site not specified: Secondary | ICD-10-CM | POA: Diagnosis not present

## 2015-03-21 DIAGNOSIS — N3090 Cystitis, unspecified without hematuria: Secondary | ICD-10-CM | POA: Diagnosis not present

## 2015-03-21 DIAGNOSIS — N952 Postmenopausal atrophic vaginitis: Secondary | ICD-10-CM | POA: Diagnosis not present

## 2015-04-05 DIAGNOSIS — B029 Zoster without complications: Secondary | ICD-10-CM | POA: Diagnosis not present

## 2015-04-24 DIAGNOSIS — E559 Vitamin D deficiency, unspecified: Secondary | ICD-10-CM | POA: Diagnosis not present

## 2015-04-24 DIAGNOSIS — I10 Essential (primary) hypertension: Secondary | ICD-10-CM | POA: Diagnosis not present

## 2015-04-24 DIAGNOSIS — Z0001 Encounter for general adult medical examination with abnormal findings: Secondary | ICD-10-CM | POA: Diagnosis not present

## 2015-04-24 DIAGNOSIS — Z124 Encounter for screening for malignant neoplasm of cervix: Secondary | ICD-10-CM | POA: Diagnosis not present

## 2015-04-24 DIAGNOSIS — R1011 Right upper quadrant pain: Secondary | ICD-10-CM | POA: Diagnosis not present

## 2015-04-24 DIAGNOSIS — E785 Hyperlipidemia, unspecified: Secondary | ICD-10-CM | POA: Diagnosis not present

## 2015-04-24 DIAGNOSIS — R1031 Right lower quadrant pain: Secondary | ICD-10-CM | POA: Diagnosis not present

## 2015-04-24 DIAGNOSIS — Z136 Encounter for screening for cardiovascular disorders: Secondary | ICD-10-CM | POA: Diagnosis not present

## 2015-04-24 DIAGNOSIS — Z79899 Other long term (current) drug therapy: Secondary | ICD-10-CM | POA: Diagnosis not present

## 2015-04-30 DIAGNOSIS — R1031 Right lower quadrant pain: Secondary | ICD-10-CM | POA: Diagnosis not present

## 2015-04-30 DIAGNOSIS — R109 Unspecified abdominal pain: Secondary | ICD-10-CM | POA: Diagnosis not present

## 2015-04-30 DIAGNOSIS — R1011 Right upper quadrant pain: Secondary | ICD-10-CM | POA: Diagnosis not present

## 2015-04-30 DIAGNOSIS — R911 Solitary pulmonary nodule: Secondary | ICD-10-CM | POA: Diagnosis not present

## 2015-04-30 DIAGNOSIS — M47896 Other spondylosis, lumbar region: Secondary | ICD-10-CM | POA: Diagnosis not present

## 2015-05-03 DIAGNOSIS — I10 Essential (primary) hypertension: Secondary | ICD-10-CM | POA: Diagnosis not present

## 2015-05-03 DIAGNOSIS — Z136 Encounter for screening for cardiovascular disorders: Secondary | ICD-10-CM | POA: Diagnosis not present

## 2015-05-03 DIAGNOSIS — Z0001 Encounter for general adult medical examination with abnormal findings: Secondary | ICD-10-CM | POA: Diagnosis not present

## 2015-05-08 DIAGNOSIS — F419 Anxiety disorder, unspecified: Secondary | ICD-10-CM | POA: Diagnosis not present

## 2015-05-08 DIAGNOSIS — R1011 Right upper quadrant pain: Secondary | ICD-10-CM | POA: Diagnosis not present

## 2015-05-20 DIAGNOSIS — Z1212 Encounter for screening for malignant neoplasm of rectum: Secondary | ICD-10-CM | POA: Diagnosis not present

## 2015-05-20 DIAGNOSIS — Z1211 Encounter for screening for malignant neoplasm of colon: Secondary | ICD-10-CM | POA: Diagnosis not present

## 2015-06-19 DIAGNOSIS — N3090 Cystitis, unspecified without hematuria: Secondary | ICD-10-CM | POA: Diagnosis not present

## 2015-06-19 DIAGNOSIS — N952 Postmenopausal atrophic vaginitis: Secondary | ICD-10-CM | POA: Diagnosis not present

## 2015-06-19 DIAGNOSIS — N3289 Other specified disorders of bladder: Secondary | ICD-10-CM | POA: Diagnosis not present

## 2015-06-19 DIAGNOSIS — F419 Anxiety disorder, unspecified: Secondary | ICD-10-CM | POA: Diagnosis not present

## 2015-09-18 DIAGNOSIS — N390 Urinary tract infection, site not specified: Secondary | ICD-10-CM | POA: Diagnosis not present

## 2015-09-18 DIAGNOSIS — N952 Postmenopausal atrophic vaginitis: Secondary | ICD-10-CM | POA: Diagnosis not present

## 2015-10-21 DIAGNOSIS — M25561 Pain in right knee: Secondary | ICD-10-CM | POA: Diagnosis not present

## 2015-10-21 DIAGNOSIS — M7989 Other specified soft tissue disorders: Secondary | ICD-10-CM | POA: Diagnosis not present

## 2015-11-12 DIAGNOSIS — M1711 Unilateral primary osteoarthritis, right knee: Secondary | ICD-10-CM | POA: Diagnosis not present

## 2015-12-03 DIAGNOSIS — R160 Hepatomegaly, not elsewhere classified: Secondary | ICD-10-CM | POA: Diagnosis not present

## 2015-12-03 DIAGNOSIS — N2889 Other specified disorders of kidney and ureter: Secondary | ICD-10-CM | POA: Diagnosis not present

## 2016-03-03 DIAGNOSIS — J0100 Acute maxillary sinusitis, unspecified: Secondary | ICD-10-CM | POA: Diagnosis not present

## 2016-03-03 DIAGNOSIS — J209 Acute bronchitis, unspecified: Secondary | ICD-10-CM | POA: Diagnosis not present

## 2016-03-24 DIAGNOSIS — J019 Acute sinusitis, unspecified: Secondary | ICD-10-CM | POA: Diagnosis not present

## 2016-03-24 DIAGNOSIS — R5383 Other fatigue: Secondary | ICD-10-CM | POA: Diagnosis not present

## 2016-04-22 DIAGNOSIS — R194 Change in bowel habit: Secondary | ICD-10-CM | POA: Diagnosis not present

## 2016-04-22 DIAGNOSIS — R001 Bradycardia, unspecified: Secondary | ICD-10-CM | POA: Diagnosis not present

## 2016-04-22 DIAGNOSIS — F419 Anxiety disorder, unspecified: Secondary | ICD-10-CM | POA: Diagnosis not present

## 2016-04-22 DIAGNOSIS — E559 Vitamin D deficiency, unspecified: Secondary | ICD-10-CM | POA: Diagnosis not present

## 2016-04-22 DIAGNOSIS — E785 Hyperlipidemia, unspecified: Secondary | ICD-10-CM | POA: Diagnosis not present

## 2016-04-22 DIAGNOSIS — I10 Essential (primary) hypertension: Secondary | ICD-10-CM | POA: Diagnosis not present

## 2016-04-22 DIAGNOSIS — Z79899 Other long term (current) drug therapy: Secondary | ICD-10-CM | POA: Diagnosis not present

## 2016-04-22 DIAGNOSIS — M542 Cervicalgia: Secondary | ICD-10-CM | POA: Diagnosis not present

## 2016-04-27 DIAGNOSIS — M542 Cervicalgia: Secondary | ICD-10-CM | POA: Diagnosis not present

## 2016-06-01 DIAGNOSIS — I700 Atherosclerosis of aorta: Secondary | ICD-10-CM | POA: Diagnosis not present

## 2016-06-01 DIAGNOSIS — M542 Cervicalgia: Secondary | ICD-10-CM | POA: Diagnosis not present

## 2016-06-01 DIAGNOSIS — J069 Acute upper respiratory infection, unspecified: Secondary | ICD-10-CM | POA: Diagnosis not present

## 2016-06-01 DIAGNOSIS — I10 Essential (primary) hypertension: Secondary | ICD-10-CM | POA: Diagnosis not present

## 2016-06-17 DIAGNOSIS — I6521 Occlusion and stenosis of right carotid artery: Secondary | ICD-10-CM | POA: Diagnosis not present

## 2016-06-17 DIAGNOSIS — I6523 Occlusion and stenosis of bilateral carotid arteries: Secondary | ICD-10-CM | POA: Diagnosis not present

## 2016-06-22 DIAGNOSIS — M4722 Other spondylosis with radiculopathy, cervical region: Secondary | ICD-10-CM | POA: Diagnosis not present

## 2016-06-22 DIAGNOSIS — M542 Cervicalgia: Secondary | ICD-10-CM | POA: Diagnosis not present

## 2016-06-30 DIAGNOSIS — M47812 Spondylosis without myelopathy or radiculopathy, cervical region: Secondary | ICD-10-CM | POA: Diagnosis not present

## 2016-06-30 DIAGNOSIS — F419 Anxiety disorder, unspecified: Secondary | ICD-10-CM | POA: Diagnosis not present

## 2016-07-13 DIAGNOSIS — I10 Essential (primary) hypertension: Secondary | ICD-10-CM | POA: Diagnosis not present

## 2016-07-13 DIAGNOSIS — Z1231 Encounter for screening mammogram for malignant neoplasm of breast: Secondary | ICD-10-CM | POA: Diagnosis not present

## 2016-07-13 DIAGNOSIS — F419 Anxiety disorder, unspecified: Secondary | ICD-10-CM | POA: Diagnosis not present

## 2016-08-05 DIAGNOSIS — R300 Dysuria: Secondary | ICD-10-CM | POA: Diagnosis not present

## 2016-08-17 DIAGNOSIS — M47812 Spondylosis without myelopathy or radiculopathy, cervical region: Secondary | ICD-10-CM | POA: Diagnosis not present

## 2016-08-28 DIAGNOSIS — L700 Acne vulgaris: Secondary | ICD-10-CM | POA: Diagnosis not present

## 2016-08-28 DIAGNOSIS — L988 Other specified disorders of the skin and subcutaneous tissue: Secondary | ICD-10-CM | POA: Diagnosis not present

## 2016-08-28 DIAGNOSIS — L989 Disorder of the skin and subcutaneous tissue, unspecified: Secondary | ICD-10-CM | POA: Diagnosis not present

## 2016-09-01 DIAGNOSIS — M47812 Spondylosis without myelopathy or radiculopathy, cervical region: Secondary | ICD-10-CM | POA: Diagnosis not present

## 2016-09-01 DIAGNOSIS — G8929 Other chronic pain: Secondary | ICD-10-CM | POA: Diagnosis not present

## 2016-09-01 DIAGNOSIS — M2560 Stiffness of unspecified joint, not elsewhere classified: Secondary | ICD-10-CM | POA: Diagnosis not present

## 2016-09-01 DIAGNOSIS — M542 Cervicalgia: Secondary | ICD-10-CM | POA: Diagnosis not present

## 2016-09-01 DIAGNOSIS — E669 Obesity, unspecified: Secondary | ICD-10-CM | POA: Diagnosis not present

## 2016-09-01 DIAGNOSIS — X501XXD Overexertion from prolonged static or awkward postures, subsequent encounter: Secondary | ICD-10-CM | POA: Diagnosis not present

## 2016-09-01 DIAGNOSIS — M62539 Muscle wasting and atrophy, not elsewhere classified, unspecified forearm: Secondary | ICD-10-CM | POA: Diagnosis not present

## 2016-09-01 DIAGNOSIS — I10 Essential (primary) hypertension: Secondary | ICD-10-CM | POA: Diagnosis not present

## 2016-09-04 DIAGNOSIS — M62539 Muscle wasting and atrophy, not elsewhere classified, unspecified forearm: Secondary | ICD-10-CM | POA: Diagnosis not present

## 2016-09-04 DIAGNOSIS — G8929 Other chronic pain: Secondary | ICD-10-CM | POA: Diagnosis not present

## 2016-09-04 DIAGNOSIS — I10 Essential (primary) hypertension: Secondary | ICD-10-CM | POA: Diagnosis not present

## 2016-09-04 DIAGNOSIS — M2560 Stiffness of unspecified joint, not elsewhere classified: Secondary | ICD-10-CM | POA: Diagnosis not present

## 2016-09-04 DIAGNOSIS — X501XXD Overexertion from prolonged static or awkward postures, subsequent encounter: Secondary | ICD-10-CM | POA: Diagnosis not present

## 2016-09-04 DIAGNOSIS — M542 Cervicalgia: Secondary | ICD-10-CM | POA: Diagnosis not present

## 2016-09-04 DIAGNOSIS — E669 Obesity, unspecified: Secondary | ICD-10-CM | POA: Diagnosis not present

## 2016-09-04 DIAGNOSIS — M47812 Spondylosis without myelopathy or radiculopathy, cervical region: Secondary | ICD-10-CM | POA: Diagnosis not present

## 2016-09-07 DIAGNOSIS — M47812 Spondylosis without myelopathy or radiculopathy, cervical region: Secondary | ICD-10-CM | POA: Diagnosis not present

## 2016-09-07 DIAGNOSIS — M542 Cervicalgia: Secondary | ICD-10-CM | POA: Diagnosis not present

## 2016-09-07 DIAGNOSIS — G8929 Other chronic pain: Secondary | ICD-10-CM | POA: Diagnosis not present

## 2016-09-07 DIAGNOSIS — X501XXD Overexertion from prolonged static or awkward postures, subsequent encounter: Secondary | ICD-10-CM | POA: Diagnosis not present

## 2016-09-07 DIAGNOSIS — M2560 Stiffness of unspecified joint, not elsewhere classified: Secondary | ICD-10-CM | POA: Diagnosis not present

## 2016-09-07 DIAGNOSIS — E669 Obesity, unspecified: Secondary | ICD-10-CM | POA: Diagnosis not present

## 2016-09-07 DIAGNOSIS — M62539 Muscle wasting and atrophy, not elsewhere classified, unspecified forearm: Secondary | ICD-10-CM | POA: Diagnosis not present

## 2016-09-07 DIAGNOSIS — I10 Essential (primary) hypertension: Secondary | ICD-10-CM | POA: Diagnosis not present

## 2016-09-08 DIAGNOSIS — G8929 Other chronic pain: Secondary | ICD-10-CM | POA: Diagnosis not present

## 2016-09-08 DIAGNOSIS — I10 Essential (primary) hypertension: Secondary | ICD-10-CM | POA: Diagnosis not present

## 2016-09-08 DIAGNOSIS — M2560 Stiffness of unspecified joint, not elsewhere classified: Secondary | ICD-10-CM | POA: Diagnosis not present

## 2016-09-08 DIAGNOSIS — E669 Obesity, unspecified: Secondary | ICD-10-CM | POA: Diagnosis not present

## 2016-09-08 DIAGNOSIS — M62539 Muscle wasting and atrophy, not elsewhere classified, unspecified forearm: Secondary | ICD-10-CM | POA: Diagnosis not present

## 2016-09-08 DIAGNOSIS — M542 Cervicalgia: Secondary | ICD-10-CM | POA: Diagnosis not present

## 2016-09-08 DIAGNOSIS — M47812 Spondylosis without myelopathy or radiculopathy, cervical region: Secondary | ICD-10-CM | POA: Diagnosis not present

## 2016-09-08 DIAGNOSIS — X501XXD Overexertion from prolonged static or awkward postures, subsequent encounter: Secondary | ICD-10-CM | POA: Diagnosis not present

## 2016-09-10 DIAGNOSIS — I10 Essential (primary) hypertension: Secondary | ICD-10-CM | POA: Diagnosis not present

## 2016-09-10 DIAGNOSIS — M62539 Muscle wasting and atrophy, not elsewhere classified, unspecified forearm: Secondary | ICD-10-CM | POA: Diagnosis not present

## 2016-09-10 DIAGNOSIS — X501XXD Overexertion from prolonged static or awkward postures, subsequent encounter: Secondary | ICD-10-CM | POA: Diagnosis not present

## 2016-09-10 DIAGNOSIS — M542 Cervicalgia: Secondary | ICD-10-CM | POA: Diagnosis not present

## 2016-09-10 DIAGNOSIS — M47812 Spondylosis without myelopathy or radiculopathy, cervical region: Secondary | ICD-10-CM | POA: Diagnosis not present

## 2016-09-10 DIAGNOSIS — G8929 Other chronic pain: Secondary | ICD-10-CM | POA: Diagnosis not present

## 2016-09-10 DIAGNOSIS — E669 Obesity, unspecified: Secondary | ICD-10-CM | POA: Diagnosis not present

## 2016-09-10 DIAGNOSIS — M2560 Stiffness of unspecified joint, not elsewhere classified: Secondary | ICD-10-CM | POA: Diagnosis not present

## 2016-09-11 DIAGNOSIS — B372 Candidiasis of skin and nail: Secondary | ICD-10-CM | POA: Diagnosis not present

## 2016-09-11 DIAGNOSIS — Z79899 Other long term (current) drug therapy: Secondary | ICD-10-CM | POA: Diagnosis not present

## 2016-09-11 DIAGNOSIS — L259 Unspecified contact dermatitis, unspecified cause: Secondary | ICD-10-CM | POA: Diagnosis not present

## 2016-09-11 DIAGNOSIS — R21 Rash and other nonspecific skin eruption: Secondary | ICD-10-CM | POA: Diagnosis not present

## 2016-09-15 DIAGNOSIS — M62539 Muscle wasting and atrophy, not elsewhere classified, unspecified forearm: Secondary | ICD-10-CM | POA: Diagnosis not present

## 2016-09-15 DIAGNOSIS — I10 Essential (primary) hypertension: Secondary | ICD-10-CM | POA: Diagnosis not present

## 2016-09-15 DIAGNOSIS — E669 Obesity, unspecified: Secondary | ICD-10-CM | POA: Diagnosis not present

## 2016-09-15 DIAGNOSIS — M47812 Spondylosis without myelopathy or radiculopathy, cervical region: Secondary | ICD-10-CM | POA: Diagnosis not present

## 2016-09-15 DIAGNOSIS — M542 Cervicalgia: Secondary | ICD-10-CM | POA: Diagnosis not present

## 2016-09-15 DIAGNOSIS — G8929 Other chronic pain: Secondary | ICD-10-CM | POA: Diagnosis not present

## 2016-09-15 DIAGNOSIS — M2560 Stiffness of unspecified joint, not elsewhere classified: Secondary | ICD-10-CM | POA: Diagnosis not present

## 2016-09-15 DIAGNOSIS — X501XXD Overexertion from prolonged static or awkward postures, subsequent encounter: Secondary | ICD-10-CM | POA: Diagnosis not present

## 2016-09-16 DIAGNOSIS — X501XXD Overexertion from prolonged static or awkward postures, subsequent encounter: Secondary | ICD-10-CM | POA: Diagnosis not present

## 2016-09-16 DIAGNOSIS — I10 Essential (primary) hypertension: Secondary | ICD-10-CM | POA: Diagnosis not present

## 2016-09-16 DIAGNOSIS — M62539 Muscle wasting and atrophy, not elsewhere classified, unspecified forearm: Secondary | ICD-10-CM | POA: Diagnosis not present

## 2016-09-16 DIAGNOSIS — M542 Cervicalgia: Secondary | ICD-10-CM | POA: Diagnosis not present

## 2016-09-16 DIAGNOSIS — M2560 Stiffness of unspecified joint, not elsewhere classified: Secondary | ICD-10-CM | POA: Diagnosis not present

## 2016-09-16 DIAGNOSIS — M47812 Spondylosis without myelopathy or radiculopathy, cervical region: Secondary | ICD-10-CM | POA: Diagnosis not present

## 2016-09-16 DIAGNOSIS — E669 Obesity, unspecified: Secondary | ICD-10-CM | POA: Diagnosis not present

## 2016-09-16 DIAGNOSIS — G8929 Other chronic pain: Secondary | ICD-10-CM | POA: Diagnosis not present

## 2016-09-18 DIAGNOSIS — M47812 Spondylosis without myelopathy or radiculopathy, cervical region: Secondary | ICD-10-CM | POA: Diagnosis not present

## 2016-09-18 DIAGNOSIS — X501XXD Overexertion from prolonged static or awkward postures, subsequent encounter: Secondary | ICD-10-CM | POA: Diagnosis not present

## 2016-09-18 DIAGNOSIS — G8929 Other chronic pain: Secondary | ICD-10-CM | POA: Diagnosis not present

## 2016-09-18 DIAGNOSIS — E669 Obesity, unspecified: Secondary | ICD-10-CM | POA: Diagnosis not present

## 2016-09-18 DIAGNOSIS — M542 Cervicalgia: Secondary | ICD-10-CM | POA: Diagnosis not present

## 2016-09-18 DIAGNOSIS — Z79899 Other long term (current) drug therapy: Secondary | ICD-10-CM | POA: Diagnosis not present

## 2016-09-18 DIAGNOSIS — I10 Essential (primary) hypertension: Secondary | ICD-10-CM | POA: Diagnosis not present

## 2016-09-18 DIAGNOSIS — M2560 Stiffness of unspecified joint, not elsewhere classified: Secondary | ICD-10-CM | POA: Diagnosis not present

## 2016-09-18 DIAGNOSIS — M62539 Muscle wasting and atrophy, not elsewhere classified, unspecified forearm: Secondary | ICD-10-CM | POA: Diagnosis not present

## 2016-09-23 DIAGNOSIS — I10 Essential (primary) hypertension: Secondary | ICD-10-CM | POA: Diagnosis not present

## 2016-09-23 DIAGNOSIS — M62539 Muscle wasting and atrophy, not elsewhere classified, unspecified forearm: Secondary | ICD-10-CM | POA: Diagnosis not present

## 2016-09-23 DIAGNOSIS — M2560 Stiffness of unspecified joint, not elsewhere classified: Secondary | ICD-10-CM | POA: Diagnosis not present

## 2016-09-23 DIAGNOSIS — X501XXD Overexertion from prolonged static or awkward postures, subsequent encounter: Secondary | ICD-10-CM | POA: Diagnosis not present

## 2016-09-23 DIAGNOSIS — M542 Cervicalgia: Secondary | ICD-10-CM | POA: Diagnosis not present

## 2016-09-23 DIAGNOSIS — M47812 Spondylosis without myelopathy or radiculopathy, cervical region: Secondary | ICD-10-CM | POA: Diagnosis not present

## 2016-09-23 DIAGNOSIS — E669 Obesity, unspecified: Secondary | ICD-10-CM | POA: Diagnosis not present

## 2016-09-23 DIAGNOSIS — G8929 Other chronic pain: Secondary | ICD-10-CM | POA: Diagnosis not present

## 2016-09-25 DIAGNOSIS — X501XXD Overexertion from prolonged static or awkward postures, subsequent encounter: Secondary | ICD-10-CM | POA: Diagnosis not present

## 2016-09-25 DIAGNOSIS — M62539 Muscle wasting and atrophy, not elsewhere classified, unspecified forearm: Secondary | ICD-10-CM | POA: Diagnosis not present

## 2016-09-25 DIAGNOSIS — M47812 Spondylosis without myelopathy or radiculopathy, cervical region: Secondary | ICD-10-CM | POA: Diagnosis not present

## 2016-09-25 DIAGNOSIS — G8929 Other chronic pain: Secondary | ICD-10-CM | POA: Diagnosis not present

## 2016-09-25 DIAGNOSIS — I10 Essential (primary) hypertension: Secondary | ICD-10-CM | POA: Diagnosis not present

## 2016-09-25 DIAGNOSIS — M542 Cervicalgia: Secondary | ICD-10-CM | POA: Diagnosis not present

## 2016-09-25 DIAGNOSIS — M2560 Stiffness of unspecified joint, not elsewhere classified: Secondary | ICD-10-CM | POA: Diagnosis not present

## 2016-09-25 DIAGNOSIS — E669 Obesity, unspecified: Secondary | ICD-10-CM | POA: Diagnosis not present

## 2016-09-28 DIAGNOSIS — G8929 Other chronic pain: Secondary | ICD-10-CM | POA: Diagnosis not present

## 2016-09-28 DIAGNOSIS — M62539 Muscle wasting and atrophy, not elsewhere classified, unspecified forearm: Secondary | ICD-10-CM | POA: Diagnosis not present

## 2016-09-28 DIAGNOSIS — I10 Essential (primary) hypertension: Secondary | ICD-10-CM | POA: Diagnosis not present

## 2016-09-28 DIAGNOSIS — E669 Obesity, unspecified: Secondary | ICD-10-CM | POA: Diagnosis not present

## 2016-09-28 DIAGNOSIS — M542 Cervicalgia: Secondary | ICD-10-CM | POA: Diagnosis not present

## 2016-09-28 DIAGNOSIS — M47812 Spondylosis without myelopathy or radiculopathy, cervical region: Secondary | ICD-10-CM | POA: Diagnosis not present

## 2016-09-28 DIAGNOSIS — X501XXD Overexertion from prolonged static or awkward postures, subsequent encounter: Secondary | ICD-10-CM | POA: Diagnosis not present

## 2016-09-28 DIAGNOSIS — M2560 Stiffness of unspecified joint, not elsewhere classified: Secondary | ICD-10-CM | POA: Diagnosis not present

## 2016-09-30 DIAGNOSIS — M2560 Stiffness of unspecified joint, not elsewhere classified: Secondary | ICD-10-CM | POA: Diagnosis not present

## 2016-09-30 DIAGNOSIS — X501XXD Overexertion from prolonged static or awkward postures, subsequent encounter: Secondary | ICD-10-CM | POA: Diagnosis not present

## 2016-09-30 DIAGNOSIS — I10 Essential (primary) hypertension: Secondary | ICD-10-CM | POA: Diagnosis not present

## 2016-09-30 DIAGNOSIS — M47812 Spondylosis without myelopathy or radiculopathy, cervical region: Secondary | ICD-10-CM | POA: Diagnosis not present

## 2016-09-30 DIAGNOSIS — M62539 Muscle wasting and atrophy, not elsewhere classified, unspecified forearm: Secondary | ICD-10-CM | POA: Diagnosis not present

## 2016-09-30 DIAGNOSIS — E669 Obesity, unspecified: Secondary | ICD-10-CM | POA: Diagnosis not present

## 2016-09-30 DIAGNOSIS — M542 Cervicalgia: Secondary | ICD-10-CM | POA: Diagnosis not present

## 2016-09-30 DIAGNOSIS — G8929 Other chronic pain: Secondary | ICD-10-CM | POA: Diagnosis not present

## 2016-10-02 DIAGNOSIS — M47812 Spondylosis without myelopathy or radiculopathy, cervical region: Secondary | ICD-10-CM | POA: Diagnosis not present

## 2016-10-02 DIAGNOSIS — M542 Cervicalgia: Secondary | ICD-10-CM | POA: Diagnosis not present

## 2016-10-02 DIAGNOSIS — M2560 Stiffness of unspecified joint, not elsewhere classified: Secondary | ICD-10-CM | POA: Diagnosis not present

## 2016-10-02 DIAGNOSIS — G8929 Other chronic pain: Secondary | ICD-10-CM | POA: Diagnosis not present

## 2016-10-02 DIAGNOSIS — M62539 Muscle wasting and atrophy, not elsewhere classified, unspecified forearm: Secondary | ICD-10-CM | POA: Diagnosis not present

## 2016-10-02 DIAGNOSIS — I10 Essential (primary) hypertension: Secondary | ICD-10-CM | POA: Diagnosis not present

## 2016-10-02 DIAGNOSIS — E669 Obesity, unspecified: Secondary | ICD-10-CM | POA: Diagnosis not present

## 2016-10-02 DIAGNOSIS — X501XXD Overexertion from prolonged static or awkward postures, subsequent encounter: Secondary | ICD-10-CM | POA: Diagnosis not present

## 2016-10-06 DIAGNOSIS — G8929 Other chronic pain: Secondary | ICD-10-CM | POA: Diagnosis not present

## 2016-10-06 DIAGNOSIS — E669 Obesity, unspecified: Secondary | ICD-10-CM | POA: Diagnosis not present

## 2016-10-06 DIAGNOSIS — M542 Cervicalgia: Secondary | ICD-10-CM | POA: Diagnosis not present

## 2016-10-06 DIAGNOSIS — M2560 Stiffness of unspecified joint, not elsewhere classified: Secondary | ICD-10-CM | POA: Diagnosis not present

## 2016-10-06 DIAGNOSIS — M47812 Spondylosis without myelopathy or radiculopathy, cervical region: Secondary | ICD-10-CM | POA: Diagnosis not present

## 2016-10-06 DIAGNOSIS — M62539 Muscle wasting and atrophy, not elsewhere classified, unspecified forearm: Secondary | ICD-10-CM | POA: Diagnosis not present

## 2016-10-06 DIAGNOSIS — X501XXD Overexertion from prolonged static or awkward postures, subsequent encounter: Secondary | ICD-10-CM | POA: Diagnosis not present

## 2016-10-06 DIAGNOSIS — I10 Essential (primary) hypertension: Secondary | ICD-10-CM | POA: Diagnosis not present

## 2016-10-08 DIAGNOSIS — G8929 Other chronic pain: Secondary | ICD-10-CM | POA: Diagnosis not present

## 2016-10-08 DIAGNOSIS — E669 Obesity, unspecified: Secondary | ICD-10-CM | POA: Diagnosis not present

## 2016-10-08 DIAGNOSIS — M542 Cervicalgia: Secondary | ICD-10-CM | POA: Diagnosis not present

## 2016-10-08 DIAGNOSIS — M62539 Muscle wasting and atrophy, not elsewhere classified, unspecified forearm: Secondary | ICD-10-CM | POA: Diagnosis not present

## 2016-10-08 DIAGNOSIS — X501XXD Overexertion from prolonged static or awkward postures, subsequent encounter: Secondary | ICD-10-CM | POA: Diagnosis not present

## 2016-10-08 DIAGNOSIS — I10 Essential (primary) hypertension: Secondary | ICD-10-CM | POA: Diagnosis not present

## 2016-10-08 DIAGNOSIS — M2560 Stiffness of unspecified joint, not elsewhere classified: Secondary | ICD-10-CM | POA: Diagnosis not present

## 2016-10-08 DIAGNOSIS — M47812 Spondylosis without myelopathy or radiculopathy, cervical region: Secondary | ICD-10-CM | POA: Diagnosis not present

## 2016-10-12 DIAGNOSIS — G8929 Other chronic pain: Secondary | ICD-10-CM | POA: Diagnosis not present

## 2016-10-12 DIAGNOSIS — I10 Essential (primary) hypertension: Secondary | ICD-10-CM | POA: Diagnosis not present

## 2016-10-12 DIAGNOSIS — M542 Cervicalgia: Secondary | ICD-10-CM | POA: Diagnosis not present

## 2016-10-12 DIAGNOSIS — M62539 Muscle wasting and atrophy, not elsewhere classified, unspecified forearm: Secondary | ICD-10-CM | POA: Diagnosis not present

## 2016-10-12 DIAGNOSIS — X501XXD Overexertion from prolonged static or awkward postures, subsequent encounter: Secondary | ICD-10-CM | POA: Diagnosis not present

## 2016-10-12 DIAGNOSIS — M2560 Stiffness of unspecified joint, not elsewhere classified: Secondary | ICD-10-CM | POA: Diagnosis not present

## 2016-10-12 DIAGNOSIS — M47812 Spondylosis without myelopathy or radiculopathy, cervical region: Secondary | ICD-10-CM | POA: Diagnosis not present

## 2016-10-12 DIAGNOSIS — E669 Obesity, unspecified: Secondary | ICD-10-CM | POA: Diagnosis not present

## 2016-10-13 DIAGNOSIS — Z79899 Other long term (current) drug therapy: Secondary | ICD-10-CM | POA: Diagnosis not present

## 2016-10-13 DIAGNOSIS — E785 Hyperlipidemia, unspecified: Secondary | ICD-10-CM | POA: Diagnosis not present

## 2016-10-13 DIAGNOSIS — L299 Pruritus, unspecified: Secondary | ICD-10-CM | POA: Diagnosis not present

## 2016-10-13 DIAGNOSIS — I10 Essential (primary) hypertension: Secondary | ICD-10-CM | POA: Diagnosis not present

## 2016-10-13 DIAGNOSIS — F419 Anxiety disorder, unspecified: Secondary | ICD-10-CM | POA: Diagnosis not present

## 2016-10-19 DIAGNOSIS — R21 Rash and other nonspecific skin eruption: Secondary | ICD-10-CM | POA: Diagnosis not present

## 2016-10-19 DIAGNOSIS — S30861A Insect bite (nonvenomous) of abdominal wall, initial encounter: Secondary | ICD-10-CM | POA: Diagnosis not present

## 2016-10-19 DIAGNOSIS — W57XXXA Bitten or stung by nonvenomous insect and other nonvenomous arthropods, initial encounter: Secondary | ICD-10-CM | POA: Diagnosis not present

## 2016-10-19 DIAGNOSIS — R51 Headache: Secondary | ICD-10-CM | POA: Diagnosis not present

## 2016-10-20 DIAGNOSIS — M542 Cervicalgia: Secondary | ICD-10-CM | POA: Diagnosis not present

## 2016-10-20 DIAGNOSIS — M62539 Muscle wasting and atrophy, not elsewhere classified, unspecified forearm: Secondary | ICD-10-CM | POA: Diagnosis not present

## 2016-10-20 DIAGNOSIS — B079 Viral wart, unspecified: Secondary | ICD-10-CM | POA: Diagnosis not present

## 2016-10-20 DIAGNOSIS — E669 Obesity, unspecified: Secondary | ICD-10-CM | POA: Diagnosis not present

## 2016-10-20 DIAGNOSIS — I10 Essential (primary) hypertension: Secondary | ICD-10-CM | POA: Diagnosis not present

## 2016-10-20 DIAGNOSIS — M47812 Spondylosis without myelopathy or radiculopathy, cervical region: Secondary | ICD-10-CM | POA: Diagnosis not present

## 2016-10-20 DIAGNOSIS — X501XXD Overexertion from prolonged static or awkward postures, subsequent encounter: Secondary | ICD-10-CM | POA: Diagnosis not present

## 2016-10-20 DIAGNOSIS — M2560 Stiffness of unspecified joint, not elsewhere classified: Secondary | ICD-10-CM | POA: Diagnosis not present

## 2016-10-20 DIAGNOSIS — G8929 Other chronic pain: Secondary | ICD-10-CM | POA: Diagnosis not present

## 2016-10-22 DIAGNOSIS — M47812 Spondylosis without myelopathy or radiculopathy, cervical region: Secondary | ICD-10-CM | POA: Diagnosis not present

## 2016-10-22 DIAGNOSIS — G8929 Other chronic pain: Secondary | ICD-10-CM | POA: Diagnosis not present

## 2016-10-22 DIAGNOSIS — E669 Obesity, unspecified: Secondary | ICD-10-CM | POA: Diagnosis not present

## 2016-10-22 DIAGNOSIS — M542 Cervicalgia: Secondary | ICD-10-CM | POA: Diagnosis not present

## 2016-10-22 DIAGNOSIS — M62539 Muscle wasting and atrophy, not elsewhere classified, unspecified forearm: Secondary | ICD-10-CM | POA: Diagnosis not present

## 2016-10-22 DIAGNOSIS — I10 Essential (primary) hypertension: Secondary | ICD-10-CM | POA: Diagnosis not present

## 2016-10-22 DIAGNOSIS — X501XXD Overexertion from prolonged static or awkward postures, subsequent encounter: Secondary | ICD-10-CM | POA: Diagnosis not present

## 2016-10-22 DIAGNOSIS — M2560 Stiffness of unspecified joint, not elsewhere classified: Secondary | ICD-10-CM | POA: Diagnosis not present

## 2017-04-05 DIAGNOSIS — R05 Cough: Secondary | ICD-10-CM | POA: Diagnosis not present

## 2017-04-05 DIAGNOSIS — M25552 Pain in left hip: Secondary | ICD-10-CM | POA: Diagnosis not present

## 2017-04-05 DIAGNOSIS — Z79899 Other long term (current) drug therapy: Secondary | ICD-10-CM | POA: Diagnosis not present

## 2017-04-05 DIAGNOSIS — F419 Anxiety disorder, unspecified: Secondary | ICD-10-CM | POA: Diagnosis not present

## 2017-04-05 DIAGNOSIS — E785 Hyperlipidemia, unspecified: Secondary | ICD-10-CM | POA: Diagnosis not present

## 2017-04-05 DIAGNOSIS — M25551 Pain in right hip: Secondary | ICD-10-CM | POA: Diagnosis not present

## 2017-04-05 DIAGNOSIS — J329 Chronic sinusitis, unspecified: Secondary | ICD-10-CM | POA: Diagnosis not present

## 2017-04-05 DIAGNOSIS — J029 Acute pharyngitis, unspecified: Secondary | ICD-10-CM | POA: Diagnosis not present

## 2017-04-05 DIAGNOSIS — R0981 Nasal congestion: Secondary | ICD-10-CM | POA: Diagnosis not present

## 2017-04-05 DIAGNOSIS — I10 Essential (primary) hypertension: Secondary | ICD-10-CM | POA: Diagnosis not present

## 2017-05-07 DIAGNOSIS — I10 Essential (primary) hypertension: Secondary | ICD-10-CM | POA: Diagnosis not present

## 2017-05-07 DIAGNOSIS — J329 Chronic sinusitis, unspecified: Secondary | ICD-10-CM | POA: Diagnosis not present

## 2017-05-07 DIAGNOSIS — M47816 Spondylosis without myelopathy or radiculopathy, lumbar region: Secondary | ICD-10-CM | POA: Diagnosis not present

## 2017-06-01 DIAGNOSIS — M5417 Radiculopathy, lumbosacral region: Secondary | ICD-10-CM | POA: Diagnosis not present

## 2017-06-01 DIAGNOSIS — M5136 Other intervertebral disc degeneration, lumbar region: Secondary | ICD-10-CM | POA: Diagnosis not present

## 2017-06-01 DIAGNOSIS — M545 Low back pain: Secondary | ICD-10-CM | POA: Diagnosis not present

## 2017-06-08 DIAGNOSIS — M533 Sacrococcygeal disorders, not elsewhere classified: Secondary | ICD-10-CM | POA: Diagnosis not present

## 2017-06-08 DIAGNOSIS — G8929 Other chronic pain: Secondary | ICD-10-CM | POA: Diagnosis not present

## 2017-06-09 DIAGNOSIS — M5417 Radiculopathy, lumbosacral region: Secondary | ICD-10-CM | POA: Diagnosis not present

## 2017-06-09 DIAGNOSIS — M5116 Intervertebral disc disorders with radiculopathy, lumbar region: Secondary | ICD-10-CM | POA: Diagnosis not present

## 2017-06-09 DIAGNOSIS — M48061 Spinal stenosis, lumbar region without neurogenic claudication: Secondary | ICD-10-CM | POA: Diagnosis not present

## 2017-06-17 DIAGNOSIS — M4310 Spondylolisthesis, site unspecified: Secondary | ICD-10-CM | POA: Diagnosis not present

## 2017-06-17 DIAGNOSIS — M519 Unspecified thoracic, thoracolumbar and lumbosacral intervertebral disc disorder: Secondary | ICD-10-CM | POA: Diagnosis not present

## 2017-06-17 DIAGNOSIS — M5136 Other intervertebral disc degeneration, lumbar region: Secondary | ICD-10-CM | POA: Diagnosis not present

## 2017-06-17 DIAGNOSIS — M48061 Spinal stenosis, lumbar region without neurogenic claudication: Secondary | ICD-10-CM | POA: Diagnosis not present

## 2017-06-17 DIAGNOSIS — M545 Low back pain: Secondary | ICD-10-CM | POA: Diagnosis not present

## 2017-06-18 ENCOUNTER — Other Ambulatory Visit: Payer: Self-pay | Admitting: Specialist

## 2017-06-18 DIAGNOSIS — M5136 Other intervertebral disc degeneration, lumbar region: Secondary | ICD-10-CM

## 2017-06-18 NOTE — Progress Notes (Signed)
Phone call to patient to verify medication list and allergies for myelogram. Pt informed to stop cymbalta and tramadol 48hrs prior to appointment time for myelogram procedure. Pt verbalized understanding.

## 2017-06-28 ENCOUNTER — Ambulatory Visit
Admission: RE | Admit: 2017-06-28 | Discharge: 2017-06-28 | Disposition: A | Discharge status: Home / Self Care | Payer: Medicare Other | Source: Ambulatory Visit | Attending: Specialist | Admitting: Specialist

## 2017-06-28 DIAGNOSIS — M5136 Other intervertebral disc degeneration, lumbar region: Secondary | ICD-10-CM

## 2017-06-28 DIAGNOSIS — M48061 Spinal stenosis, lumbar region without neurogenic claudication: Secondary | ICD-10-CM | POA: Diagnosis not present

## 2017-06-28 MED ORDER — DIAZEPAM 5 MG PO TABS
5.0000 mg | ORAL_TABLET | Freq: Once | ORAL | Status: AC
  Administered 2017-06-28: 5 mg via ORAL

## 2017-06-28 MED ORDER — IOPAMIDOL (ISOVUE-M 200) INJECTION 41%
15.0000 mL | Freq: Once | INTRAMUSCULAR | Status: AC
  Administered 2017-06-28: 15 mL via INTRATHECAL

## 2017-06-28 MED ORDER — ONDANSETRON HCL 4 MG/2ML IJ SOLN: 4.0000 mg | Freq: Four times a day (QID) | INTRAMUSCULAR | Status: DC | PRN

## 2017-06-28 NOTE — Discharge Instructions (Signed)
Myelogram Discharge Instructions  1. Go home and rest quietly for the next 24 hours.  It is important to lie flat for the next 24 hours.  Get up only to go to the restroom.  You may lie in the bed or on a couch on your back, your stomach, your left side or your right side.  You may have one pillow under your head.  You may have pillows between your knees while you are on your side or under your knees while you are on your back.  2. DO NOT drive today.  Recline the seat as far back as it will go, while still wearing your seat belt, on the way home.  3. You may get up to go to the bathroom as needed.  You may sit up for 10 minutes to eat.  You may resume your normal diet and medications unless otherwise indicated.  Drink lots of extra fluids today and tomorrow.  4. The incidence of headache, nausea, or vomiting is about 5% (one in 20 patients).  If you develop a headache, lie flat and drink plenty of fluids until the headache goes away.  Caffeinated beverages may be helpful.  If you develop severe nausea and vomiting or a headache that does not go away with flat bed rest, call [phone].  5. You may resume normal activities after your 24 hours of bed rest is over; however, do not exert yourself strongly or do any heavy lifting tomorrow. If when you get up you have a headache when standing, go back to bed and force fluids for another 24 hours.  6. Call your physician for a follow-up appointment.  The results of your myelogram will be sent directly to your physician by the following day.  7. If you have any questions or if complications develop after you arrive home, please call [phone].  Discharge instructions have been explained to the patient.  The patient, or the person responsible for the patient, fully understands these instructions.  YOU MAY RESTART YOUR CYMBALTA AND TRAMADOL TOMORROW 06/29/2017 AT 09:30AM.

## 2017-07-05 DIAGNOSIS — R300 Dysuria: Secondary | ICD-10-CM | POA: Diagnosis not present

## 2017-07-06 DIAGNOSIS — M519 Unspecified thoracic, thoracolumbar and lumbosacral intervertebral disc disorder: Secondary | ICD-10-CM | POA: Diagnosis not present

## 2017-07-06 DIAGNOSIS — M5136 Other intervertebral disc degeneration, lumbar region: Secondary | ICD-10-CM | POA: Diagnosis not present

## 2017-07-06 DIAGNOSIS — M48061 Spinal stenosis, lumbar region without neurogenic claudication: Secondary | ICD-10-CM | POA: Diagnosis not present

## 2017-07-06 DIAGNOSIS — M4310 Spondylolisthesis, site unspecified: Secondary | ICD-10-CM | POA: Diagnosis not present

## 2017-07-22 DIAGNOSIS — M5416 Radiculopathy, lumbar region: Secondary | ICD-10-CM | POA: Diagnosis not present

## 2017-07-22 DIAGNOSIS — G8929 Other chronic pain: Secondary | ICD-10-CM | POA: Diagnosis not present

## 2017-07-22 DIAGNOSIS — M810 Age-related osteoporosis without current pathological fracture: Secondary | ICD-10-CM | POA: Diagnosis not present

## 2017-07-22 DIAGNOSIS — M4316 Spondylolisthesis, lumbar region: Secondary | ICD-10-CM | POA: Diagnosis not present

## 2017-07-22 DIAGNOSIS — M48061 Spinal stenosis, lumbar region without neurogenic claudication: Secondary | ICD-10-CM | POA: Diagnosis not present

## 2017-07-22 DIAGNOSIS — M8580 Other specified disorders of bone density and structure, unspecified site: Secondary | ICD-10-CM | POA: Diagnosis not present

## 2017-07-27 DIAGNOSIS — N390 Urinary tract infection, site not specified: Secondary | ICD-10-CM | POA: Diagnosis not present

## 2017-07-27 DIAGNOSIS — Z1231 Encounter for screening mammogram for malignant neoplasm of breast: Secondary | ICD-10-CM | POA: Diagnosis not present

## 2017-07-30 DIAGNOSIS — M810 Age-related osteoporosis without current pathological fracture: Secondary | ICD-10-CM | POA: Diagnosis not present

## 2017-08-03 DIAGNOSIS — M48061 Spinal stenosis, lumbar region without neurogenic claudication: Secondary | ICD-10-CM | POA: Diagnosis not present

## 2017-08-03 DIAGNOSIS — E669 Obesity, unspecified: Secondary | ICD-10-CM | POA: Diagnosis not present

## 2017-08-03 DIAGNOSIS — K219 Gastro-esophageal reflux disease without esophagitis: Secondary | ICD-10-CM | POA: Diagnosis not present

## 2017-08-03 DIAGNOSIS — Z79890 Hormone replacement therapy (postmenopausal): Secondary | ICD-10-CM | POA: Diagnosis not present

## 2017-08-03 DIAGNOSIS — D6852 Prothrombin gene mutation: Secondary | ICD-10-CM | POA: Diagnosis not present

## 2017-08-03 DIAGNOSIS — E7800 Pure hypercholesterolemia, unspecified: Secondary | ICD-10-CM | POA: Diagnosis not present

## 2017-08-03 DIAGNOSIS — D62 Acute posthemorrhagic anemia: Secondary | ICD-10-CM | POA: Diagnosis not present

## 2017-08-03 DIAGNOSIS — F329 Major depressive disorder, single episode, unspecified: Secondary | ICD-10-CM | POA: Diagnosis not present

## 2017-08-03 DIAGNOSIS — M62838 Other muscle spasm: Secondary | ICD-10-CM | POA: Diagnosis not present

## 2017-08-03 DIAGNOSIS — M810 Age-related osteoporosis without current pathological fracture: Secondary | ICD-10-CM | POA: Diagnosis not present

## 2017-08-03 DIAGNOSIS — M4316 Spondylolisthesis, lumbar region: Secondary | ICD-10-CM | POA: Diagnosis not present

## 2017-08-03 DIAGNOSIS — E785 Hyperlipidemia, unspecified: Secondary | ICD-10-CM | POA: Diagnosis not present

## 2017-08-03 DIAGNOSIS — G4733 Obstructive sleep apnea (adult) (pediatric): Secondary | ICD-10-CM | POA: Diagnosis not present

## 2017-08-03 DIAGNOSIS — F419 Anxiety disorder, unspecified: Secondary | ICD-10-CM | POA: Diagnosis not present

## 2017-08-03 DIAGNOSIS — Z79899 Other long term (current) drug therapy: Secondary | ICD-10-CM | POA: Diagnosis not present

## 2017-08-03 DIAGNOSIS — M5416 Radiculopathy, lumbar region: Secondary | ICD-10-CM | POA: Diagnosis not present

## 2017-08-03 DIAGNOSIS — G9529 Other cord compression: Secondary | ICD-10-CM | POA: Diagnosis not present

## 2017-08-03 DIAGNOSIS — Z6832 Body mass index (BMI) 32.0-32.9, adult: Secondary | ICD-10-CM | POA: Diagnosis not present

## 2017-08-03 DIAGNOSIS — I10 Essential (primary) hypertension: Secondary | ICD-10-CM | POA: Diagnosis not present

## 2017-08-09 DIAGNOSIS — G9529 Other cord compression: Secondary | ICD-10-CM | POA: Diagnosis present

## 2017-08-09 DIAGNOSIS — E669 Obesity, unspecified: Secondary | ICD-10-CM | POA: Diagnosis present

## 2017-08-09 DIAGNOSIS — I10 Essential (primary) hypertension: Secondary | ICD-10-CM | POA: Diagnosis present

## 2017-08-09 DIAGNOSIS — M62838 Other muscle spasm: Secondary | ICD-10-CM | POA: Diagnosis not present

## 2017-08-09 DIAGNOSIS — F419 Anxiety disorder, unspecified: Secondary | ICD-10-CM | POA: Diagnosis present

## 2017-08-09 DIAGNOSIS — Z79899 Other long term (current) drug therapy: Secondary | ICD-10-CM | POA: Diagnosis not present

## 2017-08-09 DIAGNOSIS — D6852 Prothrombin gene mutation: Secondary | ICD-10-CM | POA: Diagnosis present

## 2017-08-09 DIAGNOSIS — Z79890 Hormone replacement therapy (postmenopausal): Secondary | ICD-10-CM | POA: Diagnosis not present

## 2017-08-09 DIAGNOSIS — F329 Major depressive disorder, single episode, unspecified: Secondary | ICD-10-CM | POA: Diagnosis present

## 2017-08-09 DIAGNOSIS — K219 Gastro-esophageal reflux disease without esophagitis: Secondary | ICD-10-CM | POA: Diagnosis present

## 2017-08-09 DIAGNOSIS — M8580 Other specified disorders of bone density and structure, unspecified site: Secondary | ICD-10-CM | POA: Diagnosis not present

## 2017-08-09 DIAGNOSIS — R935 Abnormal findings on diagnostic imaging of other abdominal regions, including retroperitoneum: Secondary | ICD-10-CM | POA: Diagnosis not present

## 2017-08-09 DIAGNOSIS — Z981 Arthrodesis status: Secondary | ICD-10-CM | POA: Diagnosis not present

## 2017-08-09 DIAGNOSIS — M810 Age-related osteoporosis without current pathological fracture: Secondary | ICD-10-CM | POA: Diagnosis present

## 2017-08-09 DIAGNOSIS — D62 Acute posthemorrhagic anemia: Secondary | ICD-10-CM | POA: Diagnosis not present

## 2017-08-09 DIAGNOSIS — G4733 Obstructive sleep apnea (adult) (pediatric): Secondary | ICD-10-CM | POA: Diagnosis present

## 2017-08-09 DIAGNOSIS — E7800 Pure hypercholesterolemia, unspecified: Secondary | ICD-10-CM | POA: Diagnosis present

## 2017-08-09 DIAGNOSIS — M48061 Spinal stenosis, lumbar region without neurogenic claudication: Secondary | ICD-10-CM | POA: Diagnosis not present

## 2017-08-09 DIAGNOSIS — M4316 Spondylolisthesis, lumbar region: Secondary | ICD-10-CM | POA: Diagnosis present

## 2017-08-09 DIAGNOSIS — Z978 Presence of other specified devices: Secondary | ICD-10-CM | POA: Diagnosis not present

## 2017-08-09 DIAGNOSIS — M5416 Radiculopathy, lumbar region: Secondary | ICD-10-CM | POA: Diagnosis present

## 2017-08-09 DIAGNOSIS — Z6832 Body mass index (BMI) 32.0-32.9, adult: Secondary | ICD-10-CM | POA: Diagnosis not present

## 2017-08-09 DIAGNOSIS — E785 Hyperlipidemia, unspecified: Secondary | ICD-10-CM | POA: Diagnosis present

## 2017-09-08 DIAGNOSIS — M4854XA Collapsed vertebra, not elsewhere classified, thoracic region, initial encounter for fracture: Secondary | ICD-10-CM | POA: Diagnosis not present

## 2017-09-08 DIAGNOSIS — Z9049 Acquired absence of other specified parts of digestive tract: Secondary | ICD-10-CM | POA: Diagnosis not present

## 2017-09-08 DIAGNOSIS — Z9889 Other specified postprocedural states: Secondary | ICD-10-CM | POA: Diagnosis not present

## 2017-09-08 DIAGNOSIS — Z981 Arthrodesis status: Secondary | ICD-10-CM | POA: Diagnosis not present

## 2017-09-08 DIAGNOSIS — I700 Atherosclerosis of aorta: Secondary | ICD-10-CM | POA: Diagnosis not present

## 2017-09-08 DIAGNOSIS — M4316 Spondylolisthesis, lumbar region: Secondary | ICD-10-CM | POA: Diagnosis not present

## 2017-09-08 DIAGNOSIS — M8588 Other specified disorders of bone density and structure, other site: Secondary | ICD-10-CM | POA: Diagnosis not present

## 2017-09-08 DIAGNOSIS — Z09 Encounter for follow-up examination after completed treatment for conditions other than malignant neoplasm: Secondary | ICD-10-CM | POA: Diagnosis not present

## 2017-09-08 DIAGNOSIS — I708 Atherosclerosis of other arteries: Secondary | ICD-10-CM | POA: Diagnosis not present

## 2017-09-14 DIAGNOSIS — M25651 Stiffness of right hip, not elsewhere classified: Secondary | ICD-10-CM | POA: Diagnosis not present

## 2017-09-14 DIAGNOSIS — M545 Low back pain: Secondary | ICD-10-CM | POA: Diagnosis not present

## 2017-09-14 DIAGNOSIS — R531 Weakness: Secondary | ICD-10-CM | POA: Diagnosis not present

## 2017-09-14 DIAGNOSIS — R262 Difficulty in walking, not elsewhere classified: Secondary | ICD-10-CM | POA: Diagnosis not present

## 2017-09-16 DIAGNOSIS — M25651 Stiffness of right hip, not elsewhere classified: Secondary | ICD-10-CM | POA: Diagnosis not present

## 2017-09-16 DIAGNOSIS — R531 Weakness: Secondary | ICD-10-CM | POA: Diagnosis not present

## 2017-09-16 DIAGNOSIS — M545 Low back pain: Secondary | ICD-10-CM | POA: Diagnosis not present

## 2017-09-16 DIAGNOSIS — R262 Difficulty in walking, not elsewhere classified: Secondary | ICD-10-CM | POA: Diagnosis not present

## 2017-09-21 DIAGNOSIS — M545 Low back pain: Secondary | ICD-10-CM | POA: Diagnosis not present

## 2017-09-21 DIAGNOSIS — M25651 Stiffness of right hip, not elsewhere classified: Secondary | ICD-10-CM | POA: Diagnosis not present

## 2017-09-21 DIAGNOSIS — R262 Difficulty in walking, not elsewhere classified: Secondary | ICD-10-CM | POA: Diagnosis not present

## 2017-09-21 DIAGNOSIS — R531 Weakness: Secondary | ICD-10-CM | POA: Diagnosis not present

## 2017-09-23 DIAGNOSIS — R531 Weakness: Secondary | ICD-10-CM | POA: Diagnosis not present

## 2017-09-23 DIAGNOSIS — R262 Difficulty in walking, not elsewhere classified: Secondary | ICD-10-CM | POA: Diagnosis not present

## 2017-09-23 DIAGNOSIS — M25651 Stiffness of right hip, not elsewhere classified: Secondary | ICD-10-CM | POA: Diagnosis not present

## 2017-09-23 DIAGNOSIS — M545 Low back pain: Secondary | ICD-10-CM | POA: Diagnosis not present

## 2017-09-28 DIAGNOSIS — R531 Weakness: Secondary | ICD-10-CM | POA: Diagnosis not present

## 2017-09-28 DIAGNOSIS — M545 Low back pain: Secondary | ICD-10-CM | POA: Diagnosis not present

## 2017-09-28 DIAGNOSIS — R262 Difficulty in walking, not elsewhere classified: Secondary | ICD-10-CM | POA: Diagnosis not present

## 2017-09-28 DIAGNOSIS — M25651 Stiffness of right hip, not elsewhere classified: Secondary | ICD-10-CM | POA: Diagnosis not present

## 2017-10-07 DIAGNOSIS — R531 Weakness: Secondary | ICD-10-CM | POA: Diagnosis not present

## 2017-10-07 DIAGNOSIS — M25651 Stiffness of right hip, not elsewhere classified: Secondary | ICD-10-CM | POA: Diagnosis not present

## 2017-10-07 DIAGNOSIS — R262 Difficulty in walking, not elsewhere classified: Secondary | ICD-10-CM | POA: Diagnosis not present

## 2017-10-07 DIAGNOSIS — M545 Low back pain: Secondary | ICD-10-CM | POA: Diagnosis not present

## 2017-10-08 DIAGNOSIS — R531 Weakness: Secondary | ICD-10-CM | POA: Diagnosis not present

## 2017-10-08 DIAGNOSIS — M545 Low back pain: Secondary | ICD-10-CM | POA: Diagnosis not present

## 2017-10-08 DIAGNOSIS — R262 Difficulty in walking, not elsewhere classified: Secondary | ICD-10-CM | POA: Diagnosis not present

## 2017-10-08 DIAGNOSIS — M25651 Stiffness of right hip, not elsewhere classified: Secondary | ICD-10-CM | POA: Diagnosis not present

## 2017-10-12 DIAGNOSIS — M545 Low back pain: Secondary | ICD-10-CM | POA: Diagnosis not present

## 2017-10-12 DIAGNOSIS — M25651 Stiffness of right hip, not elsewhere classified: Secondary | ICD-10-CM | POA: Diagnosis not present

## 2017-10-12 DIAGNOSIS — R262 Difficulty in walking, not elsewhere classified: Secondary | ICD-10-CM | POA: Diagnosis not present

## 2017-10-12 DIAGNOSIS — R531 Weakness: Secondary | ICD-10-CM | POA: Diagnosis not present

## 2017-10-14 DIAGNOSIS — M25651 Stiffness of right hip, not elsewhere classified: Secondary | ICD-10-CM | POA: Diagnosis not present

## 2017-10-14 DIAGNOSIS — R531 Weakness: Secondary | ICD-10-CM | POA: Diagnosis not present

## 2017-10-14 DIAGNOSIS — M545 Low back pain: Secondary | ICD-10-CM | POA: Diagnosis not present

## 2017-10-14 DIAGNOSIS — R262 Difficulty in walking, not elsewhere classified: Secondary | ICD-10-CM | POA: Diagnosis not present

## 2017-10-21 DIAGNOSIS — R531 Weakness: Secondary | ICD-10-CM | POA: Diagnosis not present

## 2017-10-21 DIAGNOSIS — M545 Low back pain: Secondary | ICD-10-CM | POA: Diagnosis not present

## 2017-10-21 DIAGNOSIS — R262 Difficulty in walking, not elsewhere classified: Secondary | ICD-10-CM | POA: Diagnosis not present

## 2017-10-21 DIAGNOSIS — M25651 Stiffness of right hip, not elsewhere classified: Secondary | ICD-10-CM | POA: Diagnosis not present

## 2017-10-26 DIAGNOSIS — R531 Weakness: Secondary | ICD-10-CM | POA: Diagnosis not present

## 2017-10-26 DIAGNOSIS — R262 Difficulty in walking, not elsewhere classified: Secondary | ICD-10-CM | POA: Diagnosis not present

## 2017-10-26 DIAGNOSIS — M545 Low back pain: Secondary | ICD-10-CM | POA: Diagnosis not present

## 2017-10-26 DIAGNOSIS — M25651 Stiffness of right hip, not elsewhere classified: Secondary | ICD-10-CM | POA: Diagnosis not present

## 2017-10-28 DIAGNOSIS — M545 Low back pain: Secondary | ICD-10-CM | POA: Diagnosis not present

## 2017-10-28 DIAGNOSIS — M25651 Stiffness of right hip, not elsewhere classified: Secondary | ICD-10-CM | POA: Diagnosis not present

## 2017-10-28 DIAGNOSIS — R531 Weakness: Secondary | ICD-10-CM | POA: Diagnosis not present

## 2017-10-28 DIAGNOSIS — R262 Difficulty in walking, not elsewhere classified: Secondary | ICD-10-CM | POA: Diagnosis not present

## 2017-11-11 DIAGNOSIS — M4186 Other forms of scoliosis, lumbar region: Secondary | ICD-10-CM | POA: Diagnosis not present

## 2017-11-11 DIAGNOSIS — S32050A Wedge compression fracture of fifth lumbar vertebra, initial encounter for closed fracture: Secondary | ICD-10-CM | POA: Diagnosis not present

## 2017-11-11 DIAGNOSIS — M4316 Spondylolisthesis, lumbar region: Secondary | ICD-10-CM | POA: Diagnosis not present

## 2017-11-11 DIAGNOSIS — Z4789 Encounter for other orthopedic aftercare: Secondary | ICD-10-CM | POA: Diagnosis not present

## 2017-11-11 DIAGNOSIS — Z9049 Acquired absence of other specified parts of digestive tract: Secondary | ICD-10-CM | POA: Diagnosis not present

## 2017-11-11 DIAGNOSIS — M8588 Other specified disorders of bone density and structure, other site: Secondary | ICD-10-CM | POA: Diagnosis not present

## 2017-11-11 DIAGNOSIS — Z981 Arthrodesis status: Secondary | ICD-10-CM | POA: Diagnosis not present

## 2017-11-11 DIAGNOSIS — G629 Polyneuropathy, unspecified: Secondary | ICD-10-CM | POA: Diagnosis not present

## 2017-11-11 DIAGNOSIS — I998 Other disorder of circulatory system: Secondary | ICD-10-CM | POA: Diagnosis not present

## 2017-12-08 DIAGNOSIS — Z79899 Other long term (current) drug therapy: Secondary | ICD-10-CM | POA: Diagnosis not present

## 2017-12-08 DIAGNOSIS — I10 Essential (primary) hypertension: Secondary | ICD-10-CM | POA: Diagnosis not present

## 2017-12-08 DIAGNOSIS — L299 Pruritus, unspecified: Secondary | ICD-10-CM | POA: Diagnosis not present

## 2017-12-08 DIAGNOSIS — Z6834 Body mass index (BMI) 34.0-34.9, adult: Secondary | ICD-10-CM | POA: Diagnosis not present

## 2017-12-29 DIAGNOSIS — Z23 Encounter for immunization: Secondary | ICD-10-CM | POA: Diagnosis not present

## 2018-01-24 DIAGNOSIS — L209 Atopic dermatitis, unspecified: Secondary | ICD-10-CM | POA: Diagnosis not present

## 2018-01-24 DIAGNOSIS — Z6834 Body mass index (BMI) 34.0-34.9, adult: Secondary | ICD-10-CM | POA: Diagnosis not present

## 2018-03-22 DIAGNOSIS — Z23 Encounter for immunization: Secondary | ICD-10-CM | POA: Diagnosis not present

## 2018-03-22 DIAGNOSIS — Z0000 Encounter for general adult medical examination without abnormal findings: Secondary | ICD-10-CM | POA: Diagnosis not present

## 2018-03-22 DIAGNOSIS — M810 Age-related osteoporosis without current pathological fracture: Secondary | ICD-10-CM | POA: Diagnosis not present

## 2018-03-22 DIAGNOSIS — E785 Hyperlipidemia, unspecified: Secondary | ICD-10-CM | POA: Diagnosis not present

## 2018-03-22 DIAGNOSIS — Z6832 Body mass index (BMI) 32.0-32.9, adult: Secondary | ICD-10-CM | POA: Diagnosis not present

## 2018-03-22 DIAGNOSIS — Z1331 Encounter for screening for depression: Secondary | ICD-10-CM | POA: Diagnosis not present

## 2018-03-22 DIAGNOSIS — Z1339 Encounter for screening examination for other mental health and behavioral disorders: Secondary | ICD-10-CM | POA: Diagnosis not present

## 2018-03-22 DIAGNOSIS — Z79899 Other long term (current) drug therapy: Secondary | ICD-10-CM | POA: Diagnosis not present

## 2018-03-22 DIAGNOSIS — E669 Obesity, unspecified: Secondary | ICD-10-CM | POA: Diagnosis not present

## 2018-03-22 DIAGNOSIS — I10 Essential (primary) hypertension: Secondary | ICD-10-CM | POA: Diagnosis not present

## 2018-04-06 DIAGNOSIS — E669 Obesity, unspecified: Secondary | ICD-10-CM | POA: Diagnosis not present

## 2018-04-06 DIAGNOSIS — N390 Urinary tract infection, site not specified: Secondary | ICD-10-CM | POA: Diagnosis not present

## 2018-04-06 DIAGNOSIS — R030 Elevated blood-pressure reading, without diagnosis of hypertension: Secondary | ICD-10-CM | POA: Diagnosis not present

## 2018-04-06 DIAGNOSIS — Z6832 Body mass index (BMI) 32.0-32.9, adult: Secondary | ICD-10-CM | POA: Diagnosis not present

## 2018-04-06 DIAGNOSIS — R300 Dysuria: Secondary | ICD-10-CM | POA: Diagnosis not present

## 2018-04-11 DIAGNOSIS — M1711 Unilateral primary osteoarthritis, right knee: Secondary | ICD-10-CM | POA: Diagnosis not present

## 2018-04-21 DIAGNOSIS — M79609 Pain in unspecified limb: Secondary | ICD-10-CM | POA: Diagnosis not present

## 2018-04-21 DIAGNOSIS — Z01818 Encounter for other preprocedural examination: Secondary | ICD-10-CM | POA: Diagnosis not present

## 2018-04-21 DIAGNOSIS — Z79899 Other long term (current) drug therapy: Secondary | ICD-10-CM | POA: Diagnosis not present

## 2018-05-17 DIAGNOSIS — R300 Dysuria: Secondary | ICD-10-CM | POA: Diagnosis not present

## 2018-06-02 DIAGNOSIS — N952 Postmenopausal atrophic vaginitis: Secondary | ICD-10-CM | POA: Diagnosis not present

## 2018-06-02 DIAGNOSIS — N3289 Other specified disorders of bladder: Secondary | ICD-10-CM | POA: Diagnosis not present

## 2018-06-02 DIAGNOSIS — N3090 Cystitis, unspecified without hematuria: Secondary | ICD-10-CM | POA: Diagnosis not present

## 2018-07-12 DIAGNOSIS — T887XXA Unspecified adverse effect of drug or medicament, initial encounter: Secondary | ICD-10-CM | POA: Diagnosis not present

## 2018-07-12 DIAGNOSIS — E785 Hyperlipidemia, unspecified: Secondary | ICD-10-CM | POA: Diagnosis not present

## 2018-07-12 DIAGNOSIS — R21 Rash and other nonspecific skin eruption: Secondary | ICD-10-CM | POA: Diagnosis not present

## 2018-07-12 DIAGNOSIS — R197 Diarrhea, unspecified: Secondary | ICD-10-CM | POA: Diagnosis not present

## 2018-07-12 DIAGNOSIS — Z79899 Other long term (current) drug therapy: Secondary | ICD-10-CM | POA: Diagnosis not present

## 2018-07-12 DIAGNOSIS — Z6833 Body mass index (BMI) 33.0-33.9, adult: Secondary | ICD-10-CM | POA: Diagnosis not present

## 2018-07-12 DIAGNOSIS — I10 Essential (primary) hypertension: Secondary | ICD-10-CM | POA: Diagnosis not present

## 2018-07-14 DIAGNOSIS — N952 Postmenopausal atrophic vaginitis: Secondary | ICD-10-CM | POA: Diagnosis not present

## 2018-07-14 DIAGNOSIS — N3090 Cystitis, unspecified without hematuria: Secondary | ICD-10-CM | POA: Diagnosis not present

## 2018-07-14 DIAGNOSIS — N3289 Other specified disorders of bladder: Secondary | ICD-10-CM | POA: Diagnosis not present

## 2018-07-21 DIAGNOSIS — K1121 Acute sialoadenitis: Secondary | ICD-10-CM | POA: Diagnosis not present

## 2018-07-21 DIAGNOSIS — Z6833 Body mass index (BMI) 33.0-33.9, adult: Secondary | ICD-10-CM | POA: Diagnosis not present

## 2018-08-30 DIAGNOSIS — Z01818 Encounter for other preprocedural examination: Secondary | ICD-10-CM | POA: Diagnosis not present

## 2018-08-30 DIAGNOSIS — M79609 Pain in unspecified limb: Secondary | ICD-10-CM | POA: Diagnosis not present

## 2018-08-30 DIAGNOSIS — Z79899 Other long term (current) drug therapy: Secondary | ICD-10-CM | POA: Diagnosis not present

## 2018-08-30 DIAGNOSIS — E559 Vitamin D deficiency, unspecified: Secondary | ICD-10-CM | POA: Diagnosis not present

## 2018-09-06 DIAGNOSIS — E785 Hyperlipidemia, unspecified: Secondary | ICD-10-CM | POA: Diagnosis present

## 2018-09-06 DIAGNOSIS — F419 Anxiety disorder, unspecified: Secondary | ICD-10-CM | POA: Diagnosis present

## 2018-09-06 DIAGNOSIS — N3289 Other specified disorders of bladder: Secondary | ICD-10-CM | POA: Diagnosis present

## 2018-09-06 DIAGNOSIS — M1711 Unilateral primary osteoarthritis, right knee: Secondary | ICD-10-CM | POA: Diagnosis present

## 2018-09-06 DIAGNOSIS — Z96651 Presence of right artificial knee joint: Secondary | ICD-10-CM | POA: Diagnosis not present

## 2018-09-06 DIAGNOSIS — E7800 Pure hypercholesterolemia, unspecified: Secondary | ICD-10-CM | POA: Diagnosis present

## 2018-09-06 DIAGNOSIS — Z79899 Other long term (current) drug therapy: Secondary | ICD-10-CM | POA: Diagnosis not present

## 2018-09-06 DIAGNOSIS — Z471 Aftercare following joint replacement surgery: Secondary | ICD-10-CM | POA: Diagnosis not present

## 2018-09-06 DIAGNOSIS — Z7982 Long term (current) use of aspirin: Secondary | ICD-10-CM | POA: Diagnosis not present

## 2018-09-06 DIAGNOSIS — I2510 Atherosclerotic heart disease of native coronary artery without angina pectoris: Secondary | ICD-10-CM | POA: Diagnosis present

## 2018-09-06 DIAGNOSIS — I10 Essential (primary) hypertension: Secondary | ICD-10-CM | POA: Diagnosis present

## 2018-09-08 DIAGNOSIS — M1990 Unspecified osteoarthritis, unspecified site: Secondary | ICD-10-CM | POA: Diagnosis not present

## 2018-09-08 DIAGNOSIS — Z471 Aftercare following joint replacement surgery: Secondary | ICD-10-CM | POA: Diagnosis not present

## 2018-09-08 DIAGNOSIS — Z7982 Long term (current) use of aspirin: Secondary | ICD-10-CM | POA: Diagnosis not present

## 2018-09-08 DIAGNOSIS — E785 Hyperlipidemia, unspecified: Secondary | ICD-10-CM | POA: Diagnosis not present

## 2018-09-08 DIAGNOSIS — F419 Anxiety disorder, unspecified: Secondary | ICD-10-CM | POA: Diagnosis not present

## 2018-09-08 DIAGNOSIS — Z96651 Presence of right artificial knee joint: Secondary | ICD-10-CM | POA: Diagnosis not present

## 2018-09-08 DIAGNOSIS — Z9181 History of falling: Secondary | ICD-10-CM | POA: Diagnosis not present

## 2018-09-08 DIAGNOSIS — I10 Essential (primary) hypertension: Secondary | ICD-10-CM | POA: Diagnosis not present

## 2018-09-08 DIAGNOSIS — Z87440 Personal history of urinary (tract) infections: Secondary | ICD-10-CM | POA: Diagnosis not present

## 2018-09-08 DIAGNOSIS — I2510 Atherosclerotic heart disease of native coronary artery without angina pectoris: Secondary | ICD-10-CM | POA: Diagnosis not present

## 2018-09-09 DIAGNOSIS — I10 Essential (primary) hypertension: Secondary | ICD-10-CM | POA: Diagnosis not present

## 2018-09-09 DIAGNOSIS — M1990 Unspecified osteoarthritis, unspecified site: Secondary | ICD-10-CM | POA: Diagnosis not present

## 2018-09-09 DIAGNOSIS — E785 Hyperlipidemia, unspecified: Secondary | ICD-10-CM | POA: Diagnosis not present

## 2018-09-09 DIAGNOSIS — I2510 Atherosclerotic heart disease of native coronary artery without angina pectoris: Secondary | ICD-10-CM | POA: Diagnosis not present

## 2018-09-09 DIAGNOSIS — Z471 Aftercare following joint replacement surgery: Secondary | ICD-10-CM | POA: Diagnosis not present

## 2018-09-09 DIAGNOSIS — F419 Anxiety disorder, unspecified: Secondary | ICD-10-CM | POA: Diagnosis not present

## 2018-09-11 DIAGNOSIS — F419 Anxiety disorder, unspecified: Secondary | ICD-10-CM | POA: Diagnosis not present

## 2018-09-11 DIAGNOSIS — I10 Essential (primary) hypertension: Secondary | ICD-10-CM | POA: Diagnosis not present

## 2018-09-11 DIAGNOSIS — E785 Hyperlipidemia, unspecified: Secondary | ICD-10-CM | POA: Diagnosis not present

## 2018-09-11 DIAGNOSIS — Z471 Aftercare following joint replacement surgery: Secondary | ICD-10-CM | POA: Diagnosis not present

## 2018-09-11 DIAGNOSIS — I2510 Atherosclerotic heart disease of native coronary artery without angina pectoris: Secondary | ICD-10-CM | POA: Diagnosis not present

## 2018-09-11 DIAGNOSIS — M1990 Unspecified osteoarthritis, unspecified site: Secondary | ICD-10-CM | POA: Diagnosis not present

## 2018-09-12 DIAGNOSIS — Z471 Aftercare following joint replacement surgery: Secondary | ICD-10-CM | POA: Diagnosis not present

## 2018-09-12 DIAGNOSIS — F419 Anxiety disorder, unspecified: Secondary | ICD-10-CM | POA: Diagnosis not present

## 2018-09-12 DIAGNOSIS — E785 Hyperlipidemia, unspecified: Secondary | ICD-10-CM | POA: Diagnosis not present

## 2018-09-12 DIAGNOSIS — I10 Essential (primary) hypertension: Secondary | ICD-10-CM | POA: Diagnosis not present

## 2018-09-12 DIAGNOSIS — I2510 Atherosclerotic heart disease of native coronary artery without angina pectoris: Secondary | ICD-10-CM | POA: Diagnosis not present

## 2018-09-12 DIAGNOSIS — M1990 Unspecified osteoarthritis, unspecified site: Secondary | ICD-10-CM | POA: Diagnosis not present

## 2018-09-14 DIAGNOSIS — E785 Hyperlipidemia, unspecified: Secondary | ICD-10-CM | POA: Diagnosis not present

## 2018-09-14 DIAGNOSIS — F419 Anxiety disorder, unspecified: Secondary | ICD-10-CM | POA: Diagnosis not present

## 2018-09-14 DIAGNOSIS — I10 Essential (primary) hypertension: Secondary | ICD-10-CM | POA: Diagnosis not present

## 2018-09-14 DIAGNOSIS — Z471 Aftercare following joint replacement surgery: Secondary | ICD-10-CM | POA: Diagnosis not present

## 2018-09-14 DIAGNOSIS — I2510 Atherosclerotic heart disease of native coronary artery without angina pectoris: Secondary | ICD-10-CM | POA: Diagnosis not present

## 2018-09-14 DIAGNOSIS — M1990 Unspecified osteoarthritis, unspecified site: Secondary | ICD-10-CM | POA: Diagnosis not present

## 2018-09-16 DIAGNOSIS — I10 Essential (primary) hypertension: Secondary | ICD-10-CM | POA: Diagnosis not present

## 2018-09-16 DIAGNOSIS — M1990 Unspecified osteoarthritis, unspecified site: Secondary | ICD-10-CM | POA: Diagnosis not present

## 2018-09-16 DIAGNOSIS — F419 Anxiety disorder, unspecified: Secondary | ICD-10-CM | POA: Diagnosis not present

## 2018-09-16 DIAGNOSIS — E785 Hyperlipidemia, unspecified: Secondary | ICD-10-CM | POA: Diagnosis not present

## 2018-09-16 DIAGNOSIS — Z471 Aftercare following joint replacement surgery: Secondary | ICD-10-CM | POA: Diagnosis not present

## 2018-09-16 DIAGNOSIS — I2510 Atherosclerotic heart disease of native coronary artery without angina pectoris: Secondary | ICD-10-CM | POA: Diagnosis not present

## 2018-09-19 DIAGNOSIS — M1990 Unspecified osteoarthritis, unspecified site: Secondary | ICD-10-CM | POA: Diagnosis not present

## 2018-09-19 DIAGNOSIS — I10 Essential (primary) hypertension: Secondary | ICD-10-CM | POA: Diagnosis not present

## 2018-09-19 DIAGNOSIS — F419 Anxiety disorder, unspecified: Secondary | ICD-10-CM | POA: Diagnosis not present

## 2018-09-19 DIAGNOSIS — Z471 Aftercare following joint replacement surgery: Secondary | ICD-10-CM | POA: Diagnosis not present

## 2018-09-19 DIAGNOSIS — I2510 Atherosclerotic heart disease of native coronary artery without angina pectoris: Secondary | ICD-10-CM | POA: Diagnosis not present

## 2018-09-19 DIAGNOSIS — E785 Hyperlipidemia, unspecified: Secondary | ICD-10-CM | POA: Diagnosis not present

## 2018-09-20 DIAGNOSIS — M25561 Pain in right knee: Secondary | ICD-10-CM | POA: Diagnosis not present

## 2018-09-20 DIAGNOSIS — I10 Essential (primary) hypertension: Secondary | ICD-10-CM | POA: Diagnosis not present

## 2018-09-20 DIAGNOSIS — M25661 Stiffness of right knee, not elsewhere classified: Secondary | ICD-10-CM | POA: Diagnosis not present

## 2018-09-20 DIAGNOSIS — R2689 Other abnormalities of gait and mobility: Secondary | ICD-10-CM | POA: Diagnosis not present

## 2018-09-22 DIAGNOSIS — M25561 Pain in right knee: Secondary | ICD-10-CM | POA: Diagnosis not present

## 2018-09-22 DIAGNOSIS — I10 Essential (primary) hypertension: Secondary | ICD-10-CM | POA: Diagnosis not present

## 2018-09-22 DIAGNOSIS — M25661 Stiffness of right knee, not elsewhere classified: Secondary | ICD-10-CM | POA: Diagnosis not present

## 2018-09-22 DIAGNOSIS — R2689 Other abnormalities of gait and mobility: Secondary | ICD-10-CM | POA: Diagnosis not present

## 2018-09-27 DIAGNOSIS — M25561 Pain in right knee: Secondary | ICD-10-CM | POA: Diagnosis not present

## 2018-09-27 DIAGNOSIS — M25661 Stiffness of right knee, not elsewhere classified: Secondary | ICD-10-CM | POA: Diagnosis not present

## 2018-09-27 DIAGNOSIS — R2689 Other abnormalities of gait and mobility: Secondary | ICD-10-CM | POA: Diagnosis not present

## 2018-09-27 DIAGNOSIS — I10 Essential (primary) hypertension: Secondary | ICD-10-CM | POA: Diagnosis not present

## 2018-10-04 DIAGNOSIS — R2689 Other abnormalities of gait and mobility: Secondary | ICD-10-CM | POA: Diagnosis not present

## 2018-10-04 DIAGNOSIS — M25661 Stiffness of right knee, not elsewhere classified: Secondary | ICD-10-CM | POA: Diagnosis not present

## 2018-10-04 DIAGNOSIS — M25561 Pain in right knee: Secondary | ICD-10-CM | POA: Diagnosis not present

## 2018-10-06 DIAGNOSIS — M25661 Stiffness of right knee, not elsewhere classified: Secondary | ICD-10-CM | POA: Diagnosis not present

## 2018-10-06 DIAGNOSIS — M25561 Pain in right knee: Secondary | ICD-10-CM | POA: Diagnosis not present

## 2018-10-06 DIAGNOSIS — R2689 Other abnormalities of gait and mobility: Secondary | ICD-10-CM | POA: Diagnosis not present

## 2018-10-11 DIAGNOSIS — M25561 Pain in right knee: Secondary | ICD-10-CM | POA: Diagnosis not present

## 2018-10-11 DIAGNOSIS — R2689 Other abnormalities of gait and mobility: Secondary | ICD-10-CM | POA: Diagnosis not present

## 2018-10-11 DIAGNOSIS — M25661 Stiffness of right knee, not elsewhere classified: Secondary | ICD-10-CM | POA: Diagnosis not present

## 2018-10-18 DIAGNOSIS — M25661 Stiffness of right knee, not elsewhere classified: Secondary | ICD-10-CM | POA: Diagnosis not present

## 2018-10-18 DIAGNOSIS — M25561 Pain in right knee: Secondary | ICD-10-CM | POA: Diagnosis not present

## 2018-10-18 DIAGNOSIS — M1711 Unilateral primary osteoarthritis, right knee: Secondary | ICD-10-CM | POA: Diagnosis not present

## 2018-10-18 DIAGNOSIS — R2689 Other abnormalities of gait and mobility: Secondary | ICD-10-CM | POA: Diagnosis not present

## 2018-10-20 DIAGNOSIS — M25661 Stiffness of right knee, not elsewhere classified: Secondary | ICD-10-CM | POA: Diagnosis not present

## 2018-10-20 DIAGNOSIS — M25561 Pain in right knee: Secondary | ICD-10-CM | POA: Diagnosis not present

## 2018-10-20 DIAGNOSIS — R2689 Other abnormalities of gait and mobility: Secondary | ICD-10-CM | POA: Diagnosis not present

## 2018-10-24 DIAGNOSIS — M810 Age-related osteoporosis without current pathological fracture: Secondary | ICD-10-CM | POA: Diagnosis not present

## 2018-10-24 DIAGNOSIS — F419 Anxiety disorder, unspecified: Secondary | ICD-10-CM | POA: Diagnosis not present

## 2018-10-24 DIAGNOSIS — I10 Essential (primary) hypertension: Secondary | ICD-10-CM | POA: Diagnosis not present

## 2018-10-24 DIAGNOSIS — E785 Hyperlipidemia, unspecified: Secondary | ICD-10-CM | POA: Diagnosis not present

## 2018-10-24 DIAGNOSIS — Z6832 Body mass index (BMI) 32.0-32.9, adult: Secondary | ICD-10-CM | POA: Diagnosis not present

## 2018-10-24 DIAGNOSIS — M542 Cervicalgia: Secondary | ICD-10-CM | POA: Diagnosis not present

## 2018-10-24 DIAGNOSIS — R0982 Postnasal drip: Secondary | ICD-10-CM | POA: Diagnosis not present

## 2018-10-24 DIAGNOSIS — Z1211 Encounter for screening for malignant neoplasm of colon: Secondary | ICD-10-CM | POA: Diagnosis not present

## 2018-10-25 DIAGNOSIS — R2689 Other abnormalities of gait and mobility: Secondary | ICD-10-CM | POA: Diagnosis not present

## 2018-10-25 DIAGNOSIS — M25661 Stiffness of right knee, not elsewhere classified: Secondary | ICD-10-CM | POA: Diagnosis not present

## 2018-10-25 DIAGNOSIS — M25561 Pain in right knee: Secondary | ICD-10-CM | POA: Diagnosis not present

## 2018-10-27 DIAGNOSIS — M25661 Stiffness of right knee, not elsewhere classified: Secondary | ICD-10-CM | POA: Diagnosis not present

## 2018-10-27 DIAGNOSIS — R2689 Other abnormalities of gait and mobility: Secondary | ICD-10-CM | POA: Diagnosis not present

## 2018-10-27 DIAGNOSIS — M25561 Pain in right knee: Secondary | ICD-10-CM | POA: Diagnosis not present

## 2018-11-01 DIAGNOSIS — M25661 Stiffness of right knee, not elsewhere classified: Secondary | ICD-10-CM | POA: Diagnosis not present

## 2018-11-01 DIAGNOSIS — M25561 Pain in right knee: Secondary | ICD-10-CM | POA: Diagnosis not present

## 2018-11-01 DIAGNOSIS — R2689 Other abnormalities of gait and mobility: Secondary | ICD-10-CM | POA: Diagnosis not present

## 2018-11-04 DIAGNOSIS — R3989 Other symptoms and signs involving the genitourinary system: Secondary | ICD-10-CM | POA: Diagnosis not present

## 2018-11-04 DIAGNOSIS — Z79899 Other long term (current) drug therapy: Secondary | ICD-10-CM | POA: Diagnosis not present

## 2018-11-08 DIAGNOSIS — M25561 Pain in right knee: Secondary | ICD-10-CM | POA: Diagnosis not present

## 2018-11-08 DIAGNOSIS — M25661 Stiffness of right knee, not elsewhere classified: Secondary | ICD-10-CM | POA: Diagnosis not present

## 2018-11-08 DIAGNOSIS — R2689 Other abnormalities of gait and mobility: Secondary | ICD-10-CM | POA: Diagnosis not present

## 2018-11-10 DIAGNOSIS — Z6832 Body mass index (BMI) 32.0-32.9, adult: Secondary | ICD-10-CM | POA: Diagnosis not present

## 2018-11-10 DIAGNOSIS — R300 Dysuria: Secondary | ICD-10-CM | POA: Diagnosis not present

## 2018-11-15 DIAGNOSIS — R2689 Other abnormalities of gait and mobility: Secondary | ICD-10-CM | POA: Diagnosis not present

## 2018-11-15 DIAGNOSIS — M25561 Pain in right knee: Secondary | ICD-10-CM | POA: Diagnosis not present

## 2018-11-15 DIAGNOSIS — M25661 Stiffness of right knee, not elsewhere classified: Secondary | ICD-10-CM | POA: Diagnosis not present

## 2018-11-18 DIAGNOSIS — M810 Age-related osteoporosis without current pathological fracture: Secondary | ICD-10-CM | POA: Diagnosis not present

## 2018-12-08 DIAGNOSIS — M1711 Unilateral primary osteoarthritis, right knee: Secondary | ICD-10-CM | POA: Diagnosis not present

## 2018-12-09 DIAGNOSIS — N8111 Cystocele, midline: Secondary | ICD-10-CM | POA: Diagnosis not present

## 2018-12-09 DIAGNOSIS — N952 Postmenopausal atrophic vaginitis: Secondary | ICD-10-CM | POA: Diagnosis not present

## 2018-12-09 DIAGNOSIS — N390 Urinary tract infection, site not specified: Secondary | ICD-10-CM | POA: Diagnosis not present

## 2018-12-09 DIAGNOSIS — B373 Candidiasis of vulva and vagina: Secondary | ICD-10-CM | POA: Diagnosis not present

## 2018-12-20 DIAGNOSIS — J0100 Acute maxillary sinusitis, unspecified: Secondary | ICD-10-CM | POA: Diagnosis not present

## 2018-12-23 DIAGNOSIS — Z23 Encounter for immunization: Secondary | ICD-10-CM | POA: Diagnosis not present

## 2019-01-16 DIAGNOSIS — F419 Anxiety disorder, unspecified: Secondary | ICD-10-CM | POA: Diagnosis not present

## 2019-01-16 DIAGNOSIS — Z6832 Body mass index (BMI) 32.0-32.9, adult: Secondary | ICD-10-CM | POA: Diagnosis not present

## 2019-02-07 DIAGNOSIS — W010XXA Fall on same level from slipping, tripping and stumbling without subsequent striking against object, initial encounter: Secondary | ICD-10-CM | POA: Diagnosis not present

## 2019-02-07 DIAGNOSIS — Z6833 Body mass index (BMI) 33.0-33.9, adult: Secondary | ICD-10-CM | POA: Diagnosis not present

## 2019-02-07 DIAGNOSIS — M7989 Other specified soft tissue disorders: Secondary | ICD-10-CM | POA: Diagnosis not present

## 2019-02-07 DIAGNOSIS — M79671 Pain in right foot: Secondary | ICD-10-CM | POA: Diagnosis not present

## 2019-02-14 DIAGNOSIS — M79671 Pain in right foot: Secondary | ICD-10-CM | POA: Diagnosis not present

## 2019-04-03 DIAGNOSIS — Z96652 Presence of left artificial knee joint: Secondary | ICD-10-CM | POA: Diagnosis not present

## 2019-05-04 DIAGNOSIS — J0140 Acute pansinusitis, unspecified: Secondary | ICD-10-CM | POA: Diagnosis not present

## 2019-05-29 DIAGNOSIS — Z6834 Body mass index (BMI) 34.0-34.9, adult: Secondary | ICD-10-CM | POA: Diagnosis not present

## 2019-05-29 DIAGNOSIS — N390 Urinary tract infection, site not specified: Secondary | ICD-10-CM | POA: Diagnosis not present

## 2019-05-29 DIAGNOSIS — R300 Dysuria: Secondary | ICD-10-CM | POA: Diagnosis not present

## 2019-06-15 DIAGNOSIS — R300 Dysuria: Secondary | ICD-10-CM | POA: Diagnosis not present

## 2019-06-15 DIAGNOSIS — Z87440 Personal history of urinary (tract) infections: Secondary | ICD-10-CM | POA: Diagnosis not present

## 2019-08-14 DIAGNOSIS — S299XXA Unspecified injury of thorax, initial encounter: Secondary | ICD-10-CM | POA: Diagnosis not present

## 2019-08-14 DIAGNOSIS — R197 Diarrhea, unspecified: Secondary | ICD-10-CM | POA: Diagnosis not present

## 2019-08-14 DIAGNOSIS — R0781 Pleurodynia: Secondary | ICD-10-CM | POA: Diagnosis not present

## 2019-08-14 DIAGNOSIS — W108XXA Fall (on) (from) other stairs and steps, initial encounter: Secondary | ICD-10-CM | POA: Diagnosis not present

## 2019-08-14 DIAGNOSIS — M79671 Pain in right foot: Secondary | ICD-10-CM | POA: Diagnosis not present

## 2019-09-15 DIAGNOSIS — E785 Hyperlipidemia, unspecified: Secondary | ICD-10-CM | POA: Diagnosis not present

## 2019-09-15 DIAGNOSIS — Z0000 Encounter for general adult medical examination without abnormal findings: Secondary | ICD-10-CM | POA: Diagnosis not present

## 2019-09-15 DIAGNOSIS — Z79899 Other long term (current) drug therapy: Secondary | ICD-10-CM | POA: Diagnosis not present

## 2019-09-15 DIAGNOSIS — Z1231 Encounter for screening mammogram for malignant neoplasm of breast: Secondary | ICD-10-CM | POA: Diagnosis not present

## 2019-09-15 DIAGNOSIS — H9193 Unspecified hearing loss, bilateral: Secondary | ICD-10-CM | POA: Diagnosis not present

## 2019-09-15 DIAGNOSIS — Z1331 Encounter for screening for depression: Secondary | ICD-10-CM | POA: Diagnosis not present

## 2019-09-15 DIAGNOSIS — M810 Age-related osteoporosis without current pathological fracture: Secondary | ICD-10-CM | POA: Diagnosis not present

## 2019-09-15 DIAGNOSIS — Z1339 Encounter for screening examination for other mental health and behavioral disorders: Secondary | ICD-10-CM | POA: Diagnosis not present

## 2019-09-15 DIAGNOSIS — I10 Essential (primary) hypertension: Secondary | ICD-10-CM | POA: Diagnosis not present

## 2019-09-15 DIAGNOSIS — Z1211 Encounter for screening for malignant neoplasm of colon: Secondary | ICD-10-CM | POA: Diagnosis not present

## 2019-09-20 DIAGNOSIS — B029 Zoster without complications: Secondary | ICD-10-CM | POA: Diagnosis not present

## 2019-10-04 DIAGNOSIS — Z79899 Other long term (current) drug therapy: Secondary | ICD-10-CM | POA: Diagnosis not present

## 2019-10-18 DIAGNOSIS — M810 Age-related osteoporosis without current pathological fracture: Secondary | ICD-10-CM | POA: Diagnosis not present

## 2019-10-25 DIAGNOSIS — M810 Age-related osteoporosis without current pathological fracture: Secondary | ICD-10-CM | POA: Diagnosis not present

## 2019-10-25 DIAGNOSIS — Z1231 Encounter for screening mammogram for malignant neoplasm of breast: Secondary | ICD-10-CM | POA: Diagnosis not present

## 2019-10-25 DIAGNOSIS — Z780 Asymptomatic menopausal state: Secondary | ICD-10-CM | POA: Diagnosis not present

## 2019-10-25 DIAGNOSIS — M85851 Other specified disorders of bone density and structure, right thigh: Secondary | ICD-10-CM | POA: Diagnosis not present

## 2019-11-02 DIAGNOSIS — I10 Essential (primary) hypertension: Secondary | ICD-10-CM | POA: Diagnosis not present

## 2019-11-02 DIAGNOSIS — N183 Chronic kidney disease, stage 3 unspecified: Secondary | ICD-10-CM | POA: Diagnosis not present

## 2019-11-02 DIAGNOSIS — Z124 Encounter for screening for malignant neoplasm of cervix: Secondary | ICD-10-CM | POA: Diagnosis not present

## 2019-11-16 DIAGNOSIS — Z79899 Other long term (current) drug therapy: Secondary | ICD-10-CM | POA: Diagnosis not present

## 2020-01-10 DIAGNOSIS — N179 Acute kidney failure, unspecified: Secondary | ICD-10-CM | POA: Diagnosis not present

## 2020-01-10 DIAGNOSIS — N19 Unspecified kidney failure: Secondary | ICD-10-CM | POA: Diagnosis not present

## 2020-01-15 DIAGNOSIS — H93299 Other abnormal auditory perceptions, unspecified ear: Secondary | ICD-10-CM | POA: Diagnosis not present

## 2020-01-15 DIAGNOSIS — H903 Sensorineural hearing loss, bilateral: Secondary | ICD-10-CM | POA: Diagnosis not present

## 2020-01-15 DIAGNOSIS — H9319 Tinnitus, unspecified ear: Secondary | ICD-10-CM | POA: Diagnosis not present

## 2020-01-15 DIAGNOSIS — T162XXA Foreign body in left ear, initial encounter: Secondary | ICD-10-CM | POA: Diagnosis not present

## 2020-01-26 DIAGNOSIS — Z23 Encounter for immunization: Secondary | ICD-10-CM | POA: Diagnosis not present

## 2020-02-06 DIAGNOSIS — N1831 Chronic kidney disease, stage 3a: Secondary | ICD-10-CM | POA: Diagnosis not present

## 2020-02-06 DIAGNOSIS — E785 Hyperlipidemia, unspecified: Secondary | ICD-10-CM | POA: Diagnosis not present

## 2020-02-06 DIAGNOSIS — F419 Anxiety disorder, unspecified: Secondary | ICD-10-CM | POA: Diagnosis not present

## 2020-02-06 DIAGNOSIS — R229 Localized swelling, mass and lump, unspecified: Secondary | ICD-10-CM | POA: Diagnosis not present

## 2020-02-06 DIAGNOSIS — I10 Essential (primary) hypertension: Secondary | ICD-10-CM | POA: Diagnosis not present

## 2020-02-06 DIAGNOSIS — Z79899 Other long term (current) drug therapy: Secondary | ICD-10-CM | POA: Diagnosis not present

## 2020-02-06 DIAGNOSIS — Z6834 Body mass index (BMI) 34.0-34.9, adult: Secondary | ICD-10-CM | POA: Diagnosis not present

## 2020-02-22 DIAGNOSIS — Z79899 Other long term (current) drug therapy: Secondary | ICD-10-CM | POA: Diagnosis not present

## 2020-02-22 DIAGNOSIS — I10 Essential (primary) hypertension: Secondary | ICD-10-CM | POA: Diagnosis not present

## 2020-04-03 DIAGNOSIS — R059 Cough, unspecified: Secondary | ICD-10-CM | POA: Diagnosis not present

## 2020-04-03 DIAGNOSIS — H66002 Acute suppurative otitis media without spontaneous rupture of ear drum, left ear: Secondary | ICD-10-CM | POA: Diagnosis not present

## 2020-04-03 DIAGNOSIS — Z1152 Encounter for screening for COVID-19: Secondary | ICD-10-CM | POA: Diagnosis not present

## 2020-04-03 DIAGNOSIS — J0100 Acute maxillary sinusitis, unspecified: Secondary | ICD-10-CM | POA: Diagnosis not present

## 2020-05-29 DIAGNOSIS — E785 Hyperlipidemia, unspecified: Secondary | ICD-10-CM | POA: Diagnosis not present

## 2020-05-29 DIAGNOSIS — I700 Atherosclerosis of aorta: Secondary | ICD-10-CM | POA: Diagnosis not present

## 2020-05-29 DIAGNOSIS — M25512 Pain in left shoulder: Secondary | ICD-10-CM | POA: Diagnosis not present

## 2020-05-29 DIAGNOSIS — N1831 Chronic kidney disease, stage 3a: Secondary | ICD-10-CM | POA: Diagnosis not present

## 2020-05-29 DIAGNOSIS — I10 Essential (primary) hypertension: Secondary | ICD-10-CM | POA: Diagnosis not present

## 2020-05-29 DIAGNOSIS — M25511 Pain in right shoulder: Secondary | ICD-10-CM | POA: Diagnosis not present

## 2020-05-29 DIAGNOSIS — Z6834 Body mass index (BMI) 34.0-34.9, adult: Secondary | ICD-10-CM | POA: Diagnosis not present

## 2020-05-29 DIAGNOSIS — E669 Obesity, unspecified: Secondary | ICD-10-CM | POA: Diagnosis not present

## 2020-05-29 DIAGNOSIS — F419 Anxiety disorder, unspecified: Secondary | ICD-10-CM | POA: Diagnosis not present

## 2020-07-22 DIAGNOSIS — S70362A Insect bite (nonvenomous), left thigh, initial encounter: Secondary | ICD-10-CM | POA: Diagnosis not present

## 2020-07-22 DIAGNOSIS — W57XXXA Bitten or stung by nonvenomous insect and other nonvenomous arthropods, initial encounter: Secondary | ICD-10-CM | POA: Diagnosis not present

## 2020-07-22 DIAGNOSIS — Z6835 Body mass index (BMI) 35.0-35.9, adult: Secondary | ICD-10-CM | POA: Diagnosis not present

## 2020-07-23 DIAGNOSIS — Z1212 Encounter for screening for malignant neoplasm of rectum: Secondary | ICD-10-CM | POA: Diagnosis not present

## 2020-07-23 DIAGNOSIS — Z1211 Encounter for screening for malignant neoplasm of colon: Secondary | ICD-10-CM | POA: Diagnosis not present

## 2020-08-29 DIAGNOSIS — M791 Myalgia, unspecified site: Secondary | ICD-10-CM | POA: Diagnosis not present

## 2020-08-29 DIAGNOSIS — J329 Chronic sinusitis, unspecified: Secondary | ICD-10-CM | POA: Diagnosis not present

## 2020-08-29 DIAGNOSIS — R209 Unspecified disturbances of skin sensation: Secondary | ICD-10-CM | POA: Diagnosis not present

## 2020-08-29 DIAGNOSIS — I700 Atherosclerosis of aorta: Secondary | ICD-10-CM | POA: Diagnosis not present

## 2020-08-29 DIAGNOSIS — R197 Diarrhea, unspecified: Secondary | ICD-10-CM | POA: Diagnosis not present

## 2020-08-29 DIAGNOSIS — M2550 Pain in unspecified joint: Secondary | ICD-10-CM | POA: Diagnosis not present

## 2020-08-29 DIAGNOSIS — M810 Age-related osteoporosis without current pathological fracture: Secondary | ICD-10-CM | POA: Diagnosis not present

## 2020-08-29 DIAGNOSIS — Z79899 Other long term (current) drug therapy: Secondary | ICD-10-CM | POA: Diagnosis not present

## 2020-08-29 DIAGNOSIS — I10 Essential (primary) hypertension: Secondary | ICD-10-CM | POA: Diagnosis not present

## 2020-08-29 DIAGNOSIS — R5383 Other fatigue: Secondary | ICD-10-CM | POA: Diagnosis not present

## 2020-08-29 DIAGNOSIS — E785 Hyperlipidemia, unspecified: Secondary | ICD-10-CM | POA: Diagnosis not present

## 2020-08-29 DIAGNOSIS — R0683 Snoring: Secondary | ICD-10-CM | POA: Diagnosis not present

## 2020-08-29 DIAGNOSIS — R1084 Generalized abdominal pain: Secondary | ICD-10-CM | POA: Diagnosis not present

## 2020-09-10 DIAGNOSIS — J329 Chronic sinusitis, unspecified: Secondary | ICD-10-CM | POA: Diagnosis not present

## 2020-09-10 DIAGNOSIS — J321 Chronic frontal sinusitis: Secondary | ICD-10-CM | POA: Diagnosis not present

## 2020-09-12 DIAGNOSIS — I10 Essential (primary) hypertension: Secondary | ICD-10-CM | POA: Diagnosis not present

## 2020-09-12 DIAGNOSIS — R739 Hyperglycemia, unspecified: Secondary | ICD-10-CM | POA: Diagnosis not present

## 2020-09-12 DIAGNOSIS — Z79899 Other long term (current) drug therapy: Secondary | ICD-10-CM | POA: Diagnosis not present

## 2020-09-12 DIAGNOSIS — N1831 Chronic kidney disease, stage 3a: Secondary | ICD-10-CM | POA: Diagnosis not present

## 2020-09-23 DIAGNOSIS — Z96652 Presence of left artificial knee joint: Secondary | ICD-10-CM | POA: Diagnosis not present

## 2020-09-26 DIAGNOSIS — Z96652 Presence of left artificial knee joint: Secondary | ICD-10-CM | POA: Diagnosis not present

## 2020-09-26 DIAGNOSIS — S8992XA Unspecified injury of left lower leg, initial encounter: Secondary | ICD-10-CM | POA: Diagnosis not present

## 2020-09-26 DIAGNOSIS — M25562 Pain in left knee: Secondary | ICD-10-CM | POA: Diagnosis not present

## 2020-10-03 DIAGNOSIS — Z20822 Contact with and (suspected) exposure to covid-19: Secondary | ICD-10-CM | POA: Diagnosis not present

## 2020-10-14 DIAGNOSIS — Z96652 Presence of left artificial knee joint: Secondary | ICD-10-CM | POA: Diagnosis not present

## 2020-11-11 DIAGNOSIS — Z20822 Contact with and (suspected) exposure to covid-19: Secondary | ICD-10-CM | POA: Diagnosis not present

## 2020-11-28 DIAGNOSIS — Z96652 Presence of left artificial knee joint: Secondary | ICD-10-CM | POA: Diagnosis not present

## 2020-12-10 DIAGNOSIS — Z79899 Other long term (current) drug therapy: Secondary | ICD-10-CM | POA: Diagnosis not present

## 2020-12-10 DIAGNOSIS — Z1231 Encounter for screening mammogram for malignant neoplasm of breast: Secondary | ICD-10-CM | POA: Diagnosis not present

## 2020-12-10 DIAGNOSIS — F419 Anxiety disorder, unspecified: Secondary | ICD-10-CM | POA: Diagnosis not present

## 2020-12-10 DIAGNOSIS — Z0001 Encounter for general adult medical examination with abnormal findings: Secondary | ICD-10-CM | POA: Diagnosis not present

## 2020-12-10 DIAGNOSIS — I10 Essential (primary) hypertension: Secondary | ICD-10-CM | POA: Diagnosis not present

## 2020-12-10 DIAGNOSIS — Z1331 Encounter for screening for depression: Secondary | ICD-10-CM | POA: Diagnosis not present

## 2020-12-10 DIAGNOSIS — M79671 Pain in right foot: Secondary | ICD-10-CM | POA: Diagnosis not present

## 2020-12-10 DIAGNOSIS — Z6834 Body mass index (BMI) 34.0-34.9, adult: Secondary | ICD-10-CM | POA: Diagnosis not present

## 2020-12-10 DIAGNOSIS — E785 Hyperlipidemia, unspecified: Secondary | ICD-10-CM | POA: Diagnosis not present

## 2020-12-10 DIAGNOSIS — R0683 Snoring: Secondary | ICD-10-CM | POA: Diagnosis not present

## 2020-12-14 DIAGNOSIS — Z23 Encounter for immunization: Secondary | ICD-10-CM | POA: Diagnosis not present

## 2020-12-16 DIAGNOSIS — M85871 Other specified disorders of bone density and structure, right ankle and foot: Secondary | ICD-10-CM | POA: Diagnosis not present

## 2020-12-16 DIAGNOSIS — M19071 Primary osteoarthritis, right ankle and foot: Secondary | ICD-10-CM | POA: Diagnosis not present

## 2020-12-16 DIAGNOSIS — Z1231 Encounter for screening mammogram for malignant neoplasm of breast: Secondary | ICD-10-CM | POA: Diagnosis not present

## 2020-12-20 DIAGNOSIS — M810 Age-related osteoporosis without current pathological fracture: Secondary | ICD-10-CM | POA: Diagnosis not present

## 2020-12-20 DIAGNOSIS — M109 Gout, unspecified: Secondary | ICD-10-CM | POA: Diagnosis not present

## 2021-01-08 DIAGNOSIS — T84033A Mechanical loosening of internal left knee prosthetic joint, initial encounter: Secondary | ICD-10-CM | POA: Diagnosis not present

## 2021-01-08 DIAGNOSIS — Z96652 Presence of left artificial knee joint: Secondary | ICD-10-CM | POA: Diagnosis not present

## 2021-01-08 DIAGNOSIS — Z20822 Contact with and (suspected) exposure to covid-19: Secondary | ICD-10-CM | POA: Diagnosis not present

## 2021-01-08 DIAGNOSIS — M810 Age-related osteoporosis without current pathological fracture: Secondary | ICD-10-CM | POA: Diagnosis not present

## 2021-02-13 DIAGNOSIS — Z20822 Contact with and (suspected) exposure to covid-19: Secondary | ICD-10-CM | POA: Diagnosis not present

## 2021-03-11 DIAGNOSIS — Z20822 Contact with and (suspected) exposure to covid-19: Secondary | ICD-10-CM | POA: Diagnosis not present

## 2021-03-18 DIAGNOSIS — M25562 Pain in left knee: Secondary | ICD-10-CM | POA: Diagnosis not present

## 2021-03-18 DIAGNOSIS — Z79899 Other long term (current) drug therapy: Secondary | ICD-10-CM | POA: Diagnosis not present

## 2021-03-18 DIAGNOSIS — N1831 Chronic kidney disease, stage 3a: Secondary | ICD-10-CM | POA: Diagnosis not present

## 2021-03-18 DIAGNOSIS — Z6834 Body mass index (BMI) 34.0-34.9, adult: Secondary | ICD-10-CM | POA: Diagnosis not present

## 2021-03-18 DIAGNOSIS — I10 Essential (primary) hypertension: Secondary | ICD-10-CM | POA: Diagnosis not present

## 2021-03-18 DIAGNOSIS — F419 Anxiety disorder, unspecified: Secondary | ICD-10-CM | POA: Diagnosis not present

## 2021-03-18 DIAGNOSIS — E785 Hyperlipidemia, unspecified: Secondary | ICD-10-CM | POA: Diagnosis not present

## 2021-03-18 DIAGNOSIS — H6123 Impacted cerumen, bilateral: Secondary | ICD-10-CM | POA: Diagnosis not present

## 2021-03-18 DIAGNOSIS — R7303 Prediabetes: Secondary | ICD-10-CM | POA: Diagnosis not present

## 2021-04-03 DIAGNOSIS — Z96652 Presence of left artificial knee joint: Secondary | ICD-10-CM | POA: Diagnosis not present

## 2021-04-03 DIAGNOSIS — M25562 Pain in left knee: Secondary | ICD-10-CM | POA: Diagnosis not present

## 2021-04-08 DIAGNOSIS — R748 Abnormal levels of other serum enzymes: Secondary | ICD-10-CM | POA: Diagnosis not present

## 2021-04-08 DIAGNOSIS — M25562 Pain in left knee: Secondary | ICD-10-CM | POA: Diagnosis not present

## 2021-04-11 DIAGNOSIS — M25562 Pain in left knee: Secondary | ICD-10-CM | POA: Diagnosis not present

## 2021-04-11 DIAGNOSIS — I10 Essential (primary) hypertension: Secondary | ICD-10-CM | POA: Diagnosis not present

## 2021-04-11 DIAGNOSIS — Z6833 Body mass index (BMI) 33.0-33.9, adult: Secondary | ICD-10-CM | POA: Diagnosis not present

## 2021-04-11 DIAGNOSIS — Z01818 Encounter for other preprocedural examination: Secondary | ICD-10-CM | POA: Diagnosis not present

## 2021-05-05 DIAGNOSIS — Z20822 Contact with and (suspected) exposure to covid-19: Secondary | ICD-10-CM | POA: Diagnosis not present

## 2021-05-06 NOTE — Progress Notes (Addendum)
Anesthesia Review:  PCP: DR Ernestene Kiel- [facility] 04/11/21 for preop clearance  Cardiologist : Chest x-ray : EKG : have requested 12 lead ekg from Dr Laqueta Due.  They are to fax.  Requested on 05/08/21. Rerequested on 05/19/21.  They are to fax. Only part of ekg was on fax,  Recalled offfice and asked them to refax.  They will refax.   Echo : Stress test: Cardiac Cath :  Activity level: can do a flgiht of stairs without difficulty  Sleep Study/ CPAP : Fasting Blood Sugar :      / Checks Blood Sugar -- times a day:   Blood Thinner/ Instructions /Last Dose: ASA / Instructions/ Last Dose :   81 mg Aspirin  Covid test on 05/19/21.  PT came in for labs and ocvid on 05/19/21.   Pt reports at time of preop that in 2015 heart rated was slow and wore a monitor which was fine per pt and seen by Dr Geraldo Pitter in 2015.  Not followed by since.   Pt called day of original preop on 05/08/21 and stated husband was coughing.  But she was fine.  Husband was driver for [employer];  Instructed pt at that time that husband should get covid tested and med hx and preop instructions were done via phone on 05/08/21 with lab appt set up for 05/19/21 the sasme day as covid appt.   When pt came in for labs on 05/19/21 pt stated husband was negative for covid but the cough went thru the entire household.  PT denies any complaints at time of labs on 05/19/21.  Labs and covid test completed on 05/19/21.

## 2021-05-06 NOTE — Progress Notes (Signed)
? ?YOU NEED TO HAVE A COVID 19 TEST ON     05/19/21                             COME THRU MAIN ENTRANCE AT [facility] HAVE A SEAT IN THE LOBBY ON THE RIGHT AS YOU COME THRU THE DOOR.  CALL [phone] AND GIVE THEM YOUR NAME AND LET THEM KNOW YOU ARE HERE FOR COVID TESTING.  ? ? Your procedure is scheduled on:  ? 05/21/21 ? Report to [facility] Main  Entrance ? ? Report to admitting at   1245pm               ?DO NOT BRING INSURANCE CARD, PICTURE ID OR WALLET DAY OF SURGERY.  ?  ? ? Call this number if you have problems the morning of surgery [phone]  ? ? REMEMBER: NO  SOLID FOODS , CANDY, GUM OR MINTS AFTER MIDNITE THE NITE BEFORE SURGERY .       [facility] CLEAR LIQUIDS UNTIL   1230 pm              DAY OF SURGERY.      PLEASE FINISH ENSURE DRINK PER SURGEON ORDER  WHICH NEEDS TO BE COMPLETED AT  1230pm        MORNING OF SURGERY.   ? ? ? ? ?CLEAR LIQUID DIET ? ? ?Foods Allowed      ?WATER ?BLACK COFFEE ( SUGAR OK, NO MILK, CREAM OR CREAMER) REGULAR AND DECAF  ?TEA ( SUGAR OK NO MILK, CREAM, OR CREAMER) REGULAR AND DECAF  ?PLAIN JELLO ( NO RED)  ?FRUIT ICES ( NO RED, NO FRUIT PULP)  ?POPSICLES ( NO RED)  ?JUICE- APPLE, WHITE GRAPE AND WHITE CRANBERRY  ?SPORT DRINK LIKE GATORADE ( NO RED)  ?CLEAR BROTH ( VEGETABLE , CHICKEN OR BEEF)                                                               ? ?    ? ?BRUSH YOUR TEETH MORNING OF SURGERY AND RINSE YOUR MOUTH OUT, NO CHEWING GUM CANDY OR MINTS. ?  ? ? Take these medicines the morning of surgery with A SIP OF WATER:  buspar, cymbalta, hydralazine, toprol  ? ? ?DO NOT TAKE ANY DIABETIC MEDICATIONS DAY OF YOUR SURGERY ?                  ?            You may not have any metal on your body including hair pins and  ?            piercings  Do not wear jewelry, make-up, lotions, powders or perfumes, deodorant ?            Do not wear nail polish on your fingernails.   ?           IF YOU ARE A FEMALE AND WANT TO SHAVE UNDER ARMS OR LEGS PRIOR TO SURGERY YOU MUST DO  SO AT LEAST 48 HOURS PRIOR TO SURGERY.  ?            Men may shave face and neck. ? ? Do not bring valuables to  the hospital. [facility] IS NOT ?            RESPONSIBLE   FOR VALUABLES. ? Contacts, dentures or bridgework may not be worn into surgery. ? Leave suitcase in the car. After surgery it may be brought to your room. ? ?  ? Patients discharged the day of surgery will not be allowed to drive home. IF YOU ARE HAVING SURGERY AND GOING HOME THE SAME DAY, YOU MUST HAVE AN ADULT TO DRIVE YOU HOME AND BE WITH YOU FOR 24 HOURS. YOU MAY GO HOME BY TAXI OR UBER OR ORTHERWISE, BUT AN ADULT MUST ACCOMPANY YOU HOME AND STAY WITH YOU FOR 24 HOURS. ?  ? ?            Please read over the following fact sheets you were given: ?_____________________________________________________________________ ? ?[facility] - Preparing for Surgery ?Before surgery, you can play an important role.  Because skin is not sterile, your skin needs to be as free of germs as possible.  You can reduce the number of germs on your skin by washing with CHG (chlorahexidine gluconate) soap before surgery.  CHG is an antiseptic cleaner which kills germs and bonds with the skin to continue killing germs even after washing. ?Please DO NOT use if you have an allergy to CHG or antibacterial soaps.  If your skin becomes reddened/irritated stop using the CHG and inform your nurse when you arrive at Short Stay. ?Do not shave (including legs and underarms) for at least 48 hours prior to the first CHG shower.  You may shave your face/neck. ?Please follow these instructions carefully: ? 1.  Shower with CHG Soap the night before surgery and the  morning of Surgery. ? 2.  If you choose to wash your hair, wash your hair first as usual with your  normal  shampoo. ? 3.  After you shampoo, rinse your hair and body thoroughly to remove the  shampoo.                           4.  Use CHG as you would any other liquid soap.  You can apply chg directly  to the skin and wash   ?                     Gently with a scrungie or clean washcloth. ? 5.  Apply the CHG Soap to your body ONLY FROM THE NECK DOWN.   Do not use on face/ open      ?                     Wound or open sores. Avoid contact with eyes, ears mouth and genitals (private parts).  ?                     Engineering geologist,  Genitals (private parts) with your normal soap. ?            6.  Wash thoroughly, paying special attention to the area where your surgery  will be performed. ? 7.  Thoroughly rinse your body with warm water from the neck down. ? 8.  DO NOT shower/wash with your normal soap after using and rinsing off  the CHG Soap. ?               9.  Pat yourself dry with a clean towel. ?  10.  Wear clean pajamas. ?           11.  Place clean sheets on your bed the night of your first shower and do not  sleep with pets. ?Day of Surgery : ?Do not apply any lotions/deodorants the morning of surgery.  Please wear clean clothes to the hospital/surgery center. ? ?FAILURE TO FOLLOW THESE INSTRUCTIONS MAY RESULT IN THE CANCELLATION OF YOUR SURGERY ?PATIENT SIGNATURE_________________________________ ? ?NURSE SIGNATURE__________________________________ ? ?________________________________________________________________________  ? ? ?           ?

## 2021-05-07 ENCOUNTER — Encounter: Payer: Self-pay | Admitting: Orthopedic Surgery

## 2021-05-07 NOTE — H&P (Signed)
TOTAL KNEE REVISION ADMISSION H&P  Patient is being admitted for left total knee arthroplasty revision.  Subjective:  Chief Complaint: Left knee pain, s/p left TKA  HPI: Kimberly Andrews is here today for LT Knee pain and second opinion on her left knee. Patient has a history of left TKA about 20 years ago, in 2005 by Dr Shellia Carwin. She did very well up until this past summer. She was seen in [place] at [facility] by Dr Samule Dry and had xrays done. They diagnosed her with a loose prosthesis. She went back for f/u in September and they told her they felt like it was loosening and would need to be replaced again. She feels like her pain has increased a lot lately. She is having trouble walking now. She takes Hydrocodone for pain daily. She can not do the things she wants to do lately. She has also notice her leg deformity.   Kimberly Andrews is a pleasant 71 year old female who comes to the clinic for evaluation of a painful left knee. She is accompanied by her husband. The patient indicates her left knee has been giving her significant trouble. She notes she had a left total knee arthroplasty in 2005 by Dr Shellia Carwin and did great until recently. She states her knee began to bother her in the summer of 2022.  She does not recall any trauma which led to these symptoms. She states her pain has been progressively worsening.   The patient indicates she is having significant pain in her knee. She notes her pain occasionally radiates down her leg. She states her knee has been swelling and warmth as well. She denies fever, chills or infectious symptoms.The patient indicates her pain is present even when she is resting. She notes her pain is aggravated by weight-bearing.   The patient indicates she was seen at [facility] and was told her knee was loosening up. She notes she was told there was no one there that could do a revision. She states she has lost movement in her knee.   The patient  indicates she has occasional pain in her hip. She notes she is feeling her leg at a different angle.and it is progressively worsening  There are no problems to display for this patient.   History reviewed. No pertinent past medical history.  History reviewed. No pertinent surgical history.  Prior to Admission medications   Medication Sig Start Date End Date Taking? Authorizing Provider  aspirin 81 MG EC tablet Chew 81 mg by mouth daily.   Yes [provider]  atorvastatin (LIPITOR) 20 MG tablet Take 20 mg by mouth at bedtime.   Yes [provider]  baclofen (LIORESAL) 10 MG tablet Take 10-20 mg by mouth 2 (two) times daily as needed for muscle spasms. 04/18/21  Yes [provider]  busPIRone (BUSPAR) 15 MG tablet Take 15 mg by mouth 3 (three) times daily. 02/20/21  Yes [provider]  Cholecalciferol (VITAMIN D) 125 MCG (5000 UT) CAPS Take 5,000 Units by mouth daily.   Yes [provider]  colchicine 0.6 MG tablet Take 0.6 mg by mouth daily as needed (gout). 12/31/20  Yes [provider]  denosumab (PROLIA) 60 MG/ML SOSY injection Inject 60 mg into the skin every 6 (six) months.   Yes [provider]  DULoxetine (CYMBALTA) 60 MG capsule Take 60 mg by mouth daily.   Yes [provider]  furosemide (LASIX) 20 MG tablet Take 20 mg by mouth daily. 04/18/21  Yes  [provider]  gabapentin (NEURONTIN) 300 MG capsule Take 600 mg by mouth at bedtime as needed for pain. 04/26/21  Yes [provider]  hydrALAZINE (APRESOLINE) 100 MG tablet Take 100 mg by mouth 3 (three) times daily.   Yes [provider]  HYDROcodone-acetaminophen (NORCO) 10-325 MG tablet Take 0.5 tablets by mouth 3 (three) times daily as needed for severe pain. 04/16/21  Yes [provider]  LORazepam (ATIVAN) 0.5 MG tablet Take 0.5 mg by mouth every 8 (eight) hours as needed for anxiety.   Yes [provider]  metoprolol  succinate (TOPROL-XL) 50 MG 24 hr tablet Take 25-50 mg by mouth See admin instructions. Take with or immediately following a meal.  50 mg in the morning, 25 mg at bedtime   Yes [provider]  montelukast (SINGULAIR) 10 MG tablet Take 10 mg by mouth at bedtime.   Yes [provider]  valsartan (DIOVAN) 320 MG tablet Take 320 mg by mouth daily.   Yes [provider]    Allergies  Allergen Reactions   Penicillins Anaphylaxis   Norvasc [Amlodipine Besylate] Palpitations and Other (See Comments)    Rapid heart rate, flushing   Tizanidine     Hallucinations, weakness    Social History   Socioeconomic History   Marital status: Married    Spouse name: Not on file   Number of children: Not on file   Years of education: Not on file   Highest education level: Not on file  Occupational History   Not on file  Tobacco Use   Smoking status: Not on file   Smokeless tobacco: Not on file  Substance and Sexual Activity   Alcohol use: Not on file   Drug use: Not on file   Sexual activity: Not on file  Other Topics Concern   Not on file  Social History Narrative   Not on file   Social Determinants of Health   Financial Resource Strain: Not on file  Food Insecurity: Not on file  Transportation Needs: Not on file  Physical Activity: Not on file  Stress: Not on file  Social Connections: Not on file  Intimate Partner Violence: Not on file    Tobacco Use: Not on file   Social History   Substance and Sexual Activity  Alcohol Use None    History reviewed. No pertinent family history.  ROS: Constitutional: no fever, no chills, no night sweats, no significant weight loss Cardiovascular: no chest pain, no palpitations Respiratory: no cough, no shortness of breath, No COPD Gastrointestinal: no vomiting, no nausea Musculoskeletal: no swelling in Joints, Joint Pain Neurologic: no numbness, no tingling, no difficulty with balance   Objective:  Physical  Exam: Well nourished and well developed.  General: Alert and oriented x3, cooperative and pleasant, no acute distress.  Head: normocephalic, atraumatic, neck supple.  Eyes: EOMI.  Respiratory: breath sounds clear in all fields, no wheezing, rales, or rhonchi. Cardiovascular: Regular rate and rhythm, no murmurs, gallops or rubs.  Abdomen: non-tender to palpation and soft, normoactive bowel sounds. Musculoskeletal:   The patient has a significantly antalgic gait pattern favoring the left side without the use of assistive devices.     Left Hip Exam:   Range of motion: Normal without discomfort.     Right Knee Exam:   No effusion present. No swelling present.   Range of motion: 0 to 125 degrees.   No crepitus on range of motion of the knee.   No medial  joint line tenderness.   No lateral joint line tenderness.   The knee is stable.     Left Knee Exam:   Moderate effusion present. No warmth present.   Range of motion: 5 degrees of hyperextension to 115 degrees of flexion.   No crepitus on range of motion of the knee.   Positive medial joint line tenderness.   No lateral joint line tenderness.   Slight varus deformity when standing.  There is laxity to valgus stressing in extension  Calves soft and nontender. Motor function intact in LE. Strength 5/5 LE bilaterally. Neuro: Distal pulses 2+. Sensation to light touch intact in LE.   Vital signs in last 24 hours: BP: ()/()  Arterial Line BP: ()/()   Imaging Review AP and lateral of the left knee dated 01/2021 from [facility] demonstrate a loose tibial component, which has shifted into varus. There is a small fracture of the proximal medial tibia just below the tibial component. These x-rays are compared to 09/2020, and there has been a significant shift into varus from 09/2020 to 01/2021.  Assessment/Plan:  ASSESSMENT: Left knee pain     PLAN: We discussed the patient's presenting complaints, history, and treatment  options. She has a loose total knee arthroplasty. It has been in for 17 years, so I believe it is aseptic loosening from polyethylene wear as the polyethylene has definitely worn more medial. It is eccentric wear medial. However, I want to aspirate this and send for Synovasure analysis to make sure we are not dealing with infection. We are also going to send her for a sedimentation rate and C-reactive protein. If indeed that all comes back normal, then we are dealing with a 1-stage revision. We did discuss that if there is infection, this would be a 2-stage revision. We will call her with the results of the Synovasure. We did discuss revision total knee arthroplasty in detail including the procedure risks, potential complications, and rehab course. She would like to proceed. We will get this done at the earliest convenient time that we can do it. Information is given to [facility] for scheduling. We will call the patient with the Synovasure and blood work results when we receive those final results. We also placed her into an economy hinged brace to hopefully prevent this from collapsing into further varus before the surgery.   Therapy Plans: [facility]  Disposition: Home with Husband Planned DVT Prophylaxis: Aspirin 325mg   DME Needed: None PCP: Ernestene Kiel, Md (clearance received) TXA: IV Allergies: PCN (anaphyaxis); Norvasc (Flushing, rapid heart rate) Anesthesia Concerns: Hx of Sleep apnea, does not use CPAP BMI: 32.6 Last HgbA1c: N/A  Pharmacy: [facility] [address] in [place]   Other:  - Daughter had history of multiple strokes while pregnant - possibly inherited factor V leiden   - Patient was instructed on what medications to stop prior to surgery. - Follow-up visit in 2 weeks with Dr. Wynelle Link - Begin physical therapy following surgery - Pre-operative lab work as pre-surgical testing - Prescriptions will be provided in hospital at time of discharge  Fenton Foy,  [facility], PA-C Orthopedic Surgery [facility] [place]

## 2021-05-08 ENCOUNTER — Encounter (HOSPITAL_COMMUNITY): Payer: Self-pay

## 2021-05-08 ENCOUNTER — Encounter (HOSPITAL_COMMUNITY)
Admission: RE | Admit: 2021-05-08 | Discharge: 2021-05-08 | Disposition: A | Discharge status: Home / Self Care | Payer: Medicare Other | Source: Ambulatory Visit | Attending: Infectious Diseases | Admitting: Infectious Diseases

## 2021-05-08 ENCOUNTER — Other Ambulatory Visit: Payer: Self-pay

## 2021-05-08 DIAGNOSIS — Z01818 Encounter for other preprocedural examination: Secondary | ICD-10-CM | POA: Diagnosis not present

## 2021-05-08 DIAGNOSIS — Z20822 Contact with and (suspected) exposure to covid-19: Secondary | ICD-10-CM | POA: Insufficient documentation

## 2021-05-08 HISTORY — DX: Chronic kidney disease, unspecified: N18.9

## 2021-05-08 HISTORY — DX: Cardiac murmur, unspecified: R01.1

## 2021-05-08 HISTORY — DX: Anxiety disorder, unspecified: F41.9

## 2021-05-08 HISTORY — DX: Personal history of urinary calculi: Z87.442

## 2021-05-08 HISTORY — DX: Unspecified osteoarthritis, unspecified site: M19.90

## 2021-05-08 HISTORY — DX: Essential (primary) hypertension: I10

## 2021-05-08 HISTORY — DX: Depression, unspecified: F32.A

## 2021-05-08 HISTORY — DX: Sleep apnea, unspecified: G47.30

## 2021-05-08 HISTORY — DX: Prediabetes: R73.03

## 2021-05-19 ENCOUNTER — Encounter (HOSPITAL_COMMUNITY)
Admission: RE | Admit: 2021-05-19 | Discharge: 2021-05-19 | Disposition: A | Discharge status: Home / Self Care | Payer: Medicare Other | Source: Ambulatory Visit

## 2021-05-19 ENCOUNTER — Other Ambulatory Visit: Payer: Self-pay

## 2021-05-19 ENCOUNTER — Encounter (HOSPITAL_COMMUNITY)
Admission: RE | Admit: 2021-05-19 | Discharge: 2021-05-19 | Disposition: A | Discharge status: Home / Self Care | Payer: Medicare Other | Source: Ambulatory Visit | Attending: Orthopedic Surgery | Admitting: Orthopedic Surgery

## 2021-05-19 DIAGNOSIS — Z96652 Presence of left artificial knee joint: Secondary | ICD-10-CM | POA: Diagnosis not present

## 2021-05-19 DIAGNOSIS — Z01818 Encounter for other preprocedural examination: Secondary | ICD-10-CM

## 2021-05-19 DIAGNOSIS — T84023A Instability of internal left knee prosthesis, initial encounter: Secondary | ICD-10-CM | POA: Diagnosis present

## 2021-05-19 DIAGNOSIS — I129 Hypertensive chronic kidney disease with stage 1 through stage 4 chronic kidney disease, or unspecified chronic kidney disease: Secondary | ICD-10-CM | POA: Diagnosis present

## 2021-05-19 DIAGNOSIS — F32A Depression, unspecified: Secondary | ICD-10-CM | POA: Diagnosis present

## 2021-05-19 DIAGNOSIS — E0844 Diabetes mellitus due to underlying condition with diabetic amyotrophy: Secondary | ICD-10-CM

## 2021-05-19 DIAGNOSIS — G8918 Other acute postprocedural pain: Secondary | ICD-10-CM | POA: Diagnosis not present

## 2021-05-19 DIAGNOSIS — Z888 Allergy status to other drugs, medicaments and biological substances status: Secondary | ICD-10-CM | POA: Diagnosis not present

## 2021-05-19 DIAGNOSIS — T84033A Mechanical loosening of internal left knee prosthetic joint, initial encounter: Secondary | ICD-10-CM | POA: Diagnosis not present

## 2021-05-19 DIAGNOSIS — Z880 Allergy status to penicillin: Secondary | ICD-10-CM | POA: Diagnosis not present

## 2021-05-19 DIAGNOSIS — T84063A Wear of articular bearing surface of internal prosthetic left knee joint, initial encounter: Secondary | ICD-10-CM | POA: Diagnosis present

## 2021-05-19 DIAGNOSIS — Z7982 Long term (current) use of aspirin: Secondary | ICD-10-CM | POA: Diagnosis not present

## 2021-05-19 DIAGNOSIS — Z20822 Contact with and (suspected) exposure to covid-19: Secondary | ICD-10-CM | POA: Diagnosis present

## 2021-05-19 DIAGNOSIS — Y792 Prosthetic and other implants, materials and accessory orthopedic devices associated with adverse incidents: Secondary | ICD-10-CM | POA: Diagnosis present

## 2021-05-19 DIAGNOSIS — G4730 Sleep apnea, unspecified: Secondary | ICD-10-CM | POA: Diagnosis present

## 2021-05-19 DIAGNOSIS — T84013A Broken internal left knee prosthesis, initial encounter: Secondary | ICD-10-CM | POA: Diagnosis not present

## 2021-05-19 DIAGNOSIS — T84093A Other mechanical complication of internal left knee prosthesis, initial encounter: Secondary | ICD-10-CM

## 2021-05-19 DIAGNOSIS — Z87442 Personal history of urinary calculi: Secondary | ICD-10-CM | POA: Diagnosis not present

## 2021-05-19 DIAGNOSIS — F419 Anxiety disorder, unspecified: Secondary | ICD-10-CM | POA: Diagnosis present

## 2021-05-19 DIAGNOSIS — Z01812 Encounter for preprocedural laboratory examination: Secondary | ICD-10-CM | POA: Diagnosis not present

## 2021-05-19 DIAGNOSIS — R7303 Prediabetes: Secondary | ICD-10-CM | POA: Diagnosis present

## 2021-05-19 DIAGNOSIS — N189 Chronic kidney disease, unspecified: Secondary | ICD-10-CM | POA: Diagnosis present

## 2021-05-19 DIAGNOSIS — Y828 Other medical devices associated with adverse incidents: Secondary | ICD-10-CM | POA: Insufficient documentation

## 2021-05-19 DIAGNOSIS — Z79899 Other long term (current) drug therapy: Secondary | ICD-10-CM | POA: Diagnosis not present

## 2021-05-19 DIAGNOSIS — M659 Synovitis and tenosynovitis, unspecified: Secondary | ICD-10-CM | POA: Diagnosis present

## 2021-05-19 LAB — COMPREHENSIVE METABOLIC PANEL
ALT: 21 U/L (ref 0–44)
AST: 20 U/L (ref 15–41)
Albumin: 3.9 g/dL (ref 3.5–5.0)
Alkaline Phosphatase: 65 U/L (ref 38–126)
Anion gap: 11 (ref 5–15)
BUN: 17 mg/dL (ref 8–23)
CO2: 25 mmol/L (ref 22–32)
Calcium: 9.3 mg/dL (ref 8.9–10.3)
Chloride: 104 mmol/L (ref 98–111)
Creatinine, Ser: 1.09 mg/dL — ABNORMAL HIGH (ref 0.44–1.00)
GFR, Estimated: 54 mL/min — ABNORMAL LOW (ref >60)
Glucose, Bld: 115 mg/dL — ABNORMAL HIGH (ref 70–99)
Potassium: 4.6 mmol/L (ref 3.5–5.1)
Sodium: 140 mmol/L (ref 135–145)
Total Bilirubin: 0.2 mg/dL — ABNORMAL LOW (ref 0.3–1.2)
Total Protein: 7.6 g/dL (ref 6.5–8.1)

## 2021-05-19 LAB — CBC
HCT: 42.7 % (ref 36.0–46.0)
Hemoglobin: 13.7 g/dL (ref 12.0–15.0)
MCH: 26.3 pg (ref 26.0–34.0)
MCHC: 32.1 g/dL (ref 30.0–36.0)
MCV: 82 fL (ref 80.0–100.0)
Platelets: 357 10*3/uL (ref 150–400)
RBC: 5.21 MIL/uL — ABNORMAL HIGH (ref 3.87–5.11)
RDW: 13.8 % (ref 11.5–15.5)
WBC: 9.3 10*3/uL (ref 4.0–10.5)
nRBC: 0 % (ref 0.0–0.2)

## 2021-05-19 LAB — SURGICAL PCR SCREEN
MRSA, PCR: NEGATIVE
Staphylococcus aureus: NEGATIVE

## 2021-05-19 LAB — PROTIME-INR
INR: 1 (ref 0.8–1.2)
Prothrombin Time: 13 seconds (ref 11.4–15.2)

## 2021-05-19 LAB — SARS CORONAVIRUS 2 (TAT 6-24 HRS): SARS Coronavirus 2: NEGATIVE

## 2021-05-19 LAB — GLUCOSE, CAPILLARY: Glucose-Capillary: 117 mg/dL — ABNORMAL HIGH (ref 70–99)

## 2021-05-19 NOTE — Progress Notes (Signed)
pREOP NURSE CALALED PT AND PT AWARE OF TIME CHANGE FOR SURGERY.  Pt TO ARRIVE AT 1145 AM ON 05/21/21 AND TO COMPLETED CLEAR LIQUIDS AND PREOP DRINK AT 1130 AM ON 05/21/21.    ?

## 2021-05-20 LAB — HEMOGLOBIN A1C
Hgb A1c MFr Bld: 6 % — ABNORMAL HIGH (ref 4.8–5.6)
Mean Plasma Glucose: 126 mg/dL

## 2021-05-21 ENCOUNTER — Other Ambulatory Visit: Payer: Self-pay

## 2021-05-21 ENCOUNTER — Encounter (HOSPITAL_COMMUNITY): Payer: Self-pay | Admitting: Orthopedic Surgery

## 2021-05-21 ENCOUNTER — Inpatient Hospital Stay (HOSPITAL_COMMUNITY)
Admission: RE | Admit: 2021-05-21 | Discharge: 2021-05-22 | DRG: 468 | Disposition: A | Discharge status: Home / Self Care | Payer: Medicare Other | Attending: Orthopedic Surgery | Admitting: Orthopedic Surgery

## 2021-05-21 ENCOUNTER — Encounter (HOSPITAL_COMMUNITY)
Admission: RE | Disposition: A | Discharge status: Home / Self Care | Payer: Self-pay | Source: Ambulatory Visit | Attending: Orthopedic Surgery

## 2021-05-21 ENCOUNTER — Inpatient Hospital Stay (HOSPITAL_COMMUNITY): Payer: Medicare Other | Admitting: Anesthesiology

## 2021-05-21 ENCOUNTER — Inpatient Hospital Stay (HOSPITAL_COMMUNITY): Payer: Medicare Other | Admitting: Physician Assistant

## 2021-05-21 DIAGNOSIS — T84093A Other mechanical complication of internal left knee prosthesis, initial encounter: Secondary | ICD-10-CM

## 2021-05-21 DIAGNOSIS — T84023A Instability of internal left knee prosthesis, initial encounter: Secondary | ICD-10-CM | POA: Diagnosis present

## 2021-05-21 DIAGNOSIS — Z87442 Personal history of urinary calculi: Secondary | ICD-10-CM

## 2021-05-21 DIAGNOSIS — T84013A Broken internal left knee prosthesis, initial encounter: Secondary | ICD-10-CM | POA: Diagnosis not present

## 2021-05-21 DIAGNOSIS — T84033A Mechanical loosening of internal left knee prosthetic joint, initial encounter: Principal | ICD-10-CM | POA: Diagnosis present

## 2021-05-21 DIAGNOSIS — Z7982 Long term (current) use of aspirin: Secondary | ICD-10-CM | POA: Diagnosis not present

## 2021-05-21 DIAGNOSIS — G8918 Other acute postprocedural pain: Secondary | ICD-10-CM | POA: Diagnosis not present

## 2021-05-21 DIAGNOSIS — Y792 Prosthetic and other implants, materials and accessory orthopedic devices associated with adverse incidents: Secondary | ICD-10-CM | POA: Diagnosis present

## 2021-05-21 DIAGNOSIS — F419 Anxiety disorder, unspecified: Secondary | ICD-10-CM | POA: Diagnosis present

## 2021-05-21 DIAGNOSIS — N189 Chronic kidney disease, unspecified: Secondary | ICD-10-CM | POA: Diagnosis present

## 2021-05-21 DIAGNOSIS — Z20822 Contact with and (suspected) exposure to covid-19: Secondary | ICD-10-CM | POA: Diagnosis present

## 2021-05-21 DIAGNOSIS — M659 Synovitis and tenosynovitis, unspecified: Secondary | ICD-10-CM | POA: Diagnosis present

## 2021-05-21 DIAGNOSIS — Z79899 Other long term (current) drug therapy: Secondary | ICD-10-CM

## 2021-05-21 DIAGNOSIS — Z01812 Encounter for preprocedural laboratory examination: Secondary | ICD-10-CM | POA: Diagnosis not present

## 2021-05-21 DIAGNOSIS — I129 Hypertensive chronic kidney disease with stage 1 through stage 4 chronic kidney disease, or unspecified chronic kidney disease: Secondary | ICD-10-CM | POA: Diagnosis present

## 2021-05-21 DIAGNOSIS — Z880 Allergy status to penicillin: Secondary | ICD-10-CM

## 2021-05-21 DIAGNOSIS — G4730 Sleep apnea, unspecified: Secondary | ICD-10-CM | POA: Diagnosis present

## 2021-05-21 DIAGNOSIS — T84063A Wear of articular bearing surface of internal prosthetic left knee joint, initial encounter: Secondary | ICD-10-CM | POA: Diagnosis present

## 2021-05-21 DIAGNOSIS — Z888 Allergy status to other drugs, medicaments and biological substances status: Secondary | ICD-10-CM | POA: Diagnosis not present

## 2021-05-21 DIAGNOSIS — F32A Depression, unspecified: Secondary | ICD-10-CM | POA: Diagnosis present

## 2021-05-21 DIAGNOSIS — Z96652 Presence of left artificial knee joint: Secondary | ICD-10-CM | POA: Diagnosis not present

## 2021-05-21 DIAGNOSIS — R7303 Prediabetes: Secondary | ICD-10-CM | POA: Diagnosis present

## 2021-05-21 LAB — TYPE AND SCREEN
ABO/RH(D): A POS
Antibody Screen: NEGATIVE

## 2021-05-21 SURGERY — TOTAL KNEE REVISION
Anesthesia: General | Site: Knee | Laterality: Left

## 2021-05-21 MED ORDER — MENTHOL 3 MG MT LOZG: 1.0000 | LOZENGE | OROMUCOSAL | Status: DC | PRN

## 2021-05-21 MED ORDER — ONDANSETRON HCL 4 MG/2ML IJ SOLN: 4.0000 mg | Freq: Four times a day (QID) | INTRAMUSCULAR | Status: DC | PRN

## 2021-05-21 MED ORDER — OXYCODONE HCL 5 MG PO TABS
10.0000 mg | ORAL_TABLET | ORAL | Status: DC | PRN
  Administered 2021-05-21 – 2021-05-22 (×2): 15 mg via ORAL
  Filled 2021-05-21 (×4): qty 3

## 2021-05-21 MED ORDER — FENTANYL CITRATE (PF) 100 MCG/2ML IJ SOLN
INTRAMUSCULAR | Status: AC
  Filled 2021-05-21: qty 2

## 2021-05-21 MED ORDER — HYDRALAZINE HCL 50 MG PO TABS
100.0000 mg | ORAL_TABLET | Freq: Three times a day (TID) | ORAL | Status: DC
  Administered 2021-05-21 – 2021-05-22 (×2): 100 mg via ORAL
  Filled 2021-05-21 (×2): qty 2

## 2021-05-21 MED ORDER — BUPIVACAINE LIPOSOME 1.3 % IJ SUSP: 20.0000 mL | Freq: Once | INTRAMUSCULAR | Status: DC

## 2021-05-21 MED ORDER — PHENYLEPHRINE HCL-NACL 20-0.9 MG/250ML-% IV SOLN
INTRAVENOUS | Status: AC
  Filled 2021-05-21: qty 250

## 2021-05-21 MED ORDER — MIDAZOLAM HCL 2 MG/2ML IJ SOLN
1.0000 mg | INTRAMUSCULAR | Status: DC
  Filled 2021-05-21: qty 2

## 2021-05-21 MED ORDER — PROPOFOL 10 MG/ML IV BOLUS
INTRAVENOUS | Status: DC | PRN
  Administered 2021-05-21: 150 mg via INTRAVENOUS

## 2021-05-21 MED ORDER — BUPIVACAINE LIPOSOME 1.3 % IJ SUSP
INTRAMUSCULAR | Status: AC
  Filled 2021-05-21: qty 20

## 2021-05-21 MED ORDER — ORAL CARE MOUTH RINSE: 15.0000 mL | Freq: Once | OROMUCOSAL | Status: AC

## 2021-05-21 MED ORDER — POLYETHYLENE GLYCOL 3350 17 G PO PACK: 17.0000 g | PACK | Freq: Every day | ORAL | Status: DC | PRN

## 2021-05-21 MED ORDER — TRANEXAMIC ACID-NACL 1000-0.7 MG/100ML-% IV SOLN
1000.0000 mg | INTRAVENOUS | Status: AC
  Administered 2021-05-21: 1000 mg via INTRAVENOUS
  Filled 2021-05-21: qty 100

## 2021-05-21 MED ORDER — MONTELUKAST SODIUM 10 MG PO TABS
10.0000 mg | ORAL_TABLET | Freq: Every day | ORAL | Status: DC
  Administered 2021-05-21: 10 mg via ORAL
  Filled 2021-05-21: qty 1

## 2021-05-21 MED ORDER — DIPHENHYDRAMINE HCL 12.5 MG/5ML PO ELIX: 12.5000 mg | ORAL_SOLUTION | ORAL | Status: DC | PRN

## 2021-05-21 MED ORDER — POVIDONE-IODINE 10 % EX SWAB
2.0000 "application " | Freq: Once | CUTANEOUS | Status: AC
  Administered 2021-05-21: 2 via TOPICAL

## 2021-05-21 MED ORDER — ONDANSETRON HCL 4 MG/2ML IJ SOLN
INTRAMUSCULAR | Status: DC | PRN
  Administered 2021-05-21: 4 mg via INTRAVENOUS

## 2021-05-21 MED ORDER — BUSPIRONE HCL 5 MG PO TABS
15.0000 mg | ORAL_TABLET | Freq: Three times a day (TID) | ORAL | Status: DC
  Administered 2021-05-21 – 2021-05-22 (×2): 15 mg via ORAL
  Filled 2021-05-21 (×2): qty 1

## 2021-05-21 MED ORDER — IRBESARTAN 150 MG PO TABS
300.0000 mg | ORAL_TABLET | Freq: Every day | ORAL | Status: DC
  Administered 2021-05-22: 300 mg via ORAL
  Filled 2021-05-21: qty 2

## 2021-05-21 MED ORDER — ROPIVACAINE HCL 5 MG/ML IJ SOLN
INTRAMUSCULAR | Status: DC | PRN
  Administered 2021-05-21: 20 mL via PERINEURAL

## 2021-05-21 MED ORDER — DEXAMETHASONE SODIUM PHOSPHATE 10 MG/ML IJ SOLN
INTRAMUSCULAR | Status: AC
  Filled 2021-05-21: qty 1

## 2021-05-21 MED ORDER — ASPIRIN EC 325 MG PO TBEC
325.0000 mg | DELAYED_RELEASE_TABLET | Freq: Two times a day (BID) | ORAL | Status: DC
  Administered 2021-05-22: 325 mg via ORAL
  Filled 2021-05-21: qty 1

## 2021-05-21 MED ORDER — LACTATED RINGERS IV SOLN: INTRAVENOUS | Status: DC

## 2021-05-21 MED ORDER — LIDOCAINE HCL (PF) 2 % IJ SOLN
INTRAMUSCULAR | Status: AC
  Filled 2021-05-21: qty 5

## 2021-05-21 MED ORDER — METOPROLOL SUCCINATE ER 25 MG PO TB24: 25.0000 mg | ORAL_TABLET | ORAL | Status: DC

## 2021-05-21 MED ORDER — MORPHINE SULFATE (PF) 2 MG/ML IV SOLN
0.5000 mg | INTRAVENOUS | Status: DC | PRN
  Administered 2021-05-21: 1 mg via INTRAVENOUS
  Administered 2021-05-21: 0.5 mg via INTRAVENOUS
  Filled 2021-05-21 (×2): qty 1

## 2021-05-21 MED ORDER — VANCOMYCIN HCL IN DEXTROSE 1-5 GM/200ML-% IV SOLN
1000.0000 mg | Freq: Two times a day (BID) | INTRAVENOUS | Status: AC
  Administered 2021-05-22: 1000 mg via INTRAVENOUS
  Filled 2021-05-21: qty 200

## 2021-05-21 MED ORDER — VANCOMYCIN HCL IN DEXTROSE 1-5 GM/200ML-% IV SOLN
1000.0000 mg | INTRAVENOUS | Status: AC
  Administered 2021-05-21: 1000 mg via INTRAVENOUS
  Filled 2021-05-21: qty 200

## 2021-05-21 MED ORDER — DOCUSATE SODIUM 100 MG PO CAPS
100.0000 mg | ORAL_CAPSULE | Freq: Two times a day (BID) | ORAL | Status: DC
  Administered 2021-05-21 – 2021-05-22 (×2): 100 mg via ORAL
  Filled 2021-05-21 (×2): qty 1

## 2021-05-21 MED ORDER — METOPROLOL SUCCINATE ER 25 MG PO TB24
25.0000 mg | ORAL_TABLET | Freq: Every day | ORAL | Status: DC
  Administered 2021-05-21: 25 mg via ORAL
  Filled 2021-05-21: qty 1

## 2021-05-21 MED ORDER — FUROSEMIDE 20 MG PO TABS
20.0000 mg | ORAL_TABLET | Freq: Every day | ORAL | Status: DC
Start: 2021-05-22 — End: 2021-05-22
  Administered 2021-05-22: 20 mg via ORAL
  Filled 2021-05-21: qty 1

## 2021-05-21 MED ORDER — FENTANYL CITRATE PF 50 MCG/ML IJ SOSY: 25.0000 ug | PREFILLED_SYRINGE | INTRAMUSCULAR | Status: DC | PRN

## 2021-05-21 MED ORDER — SODIUM CHLORIDE 0.9 % IV SOLN
INTRAVENOUS | Status: DC | PRN
  Administered 2021-05-21: 80 mL

## 2021-05-21 MED ORDER — COLCHICINE 0.6 MG PO TABS: 0.6000 mg | ORAL_TABLET | Freq: Every day | ORAL | Status: DC | PRN

## 2021-05-21 MED ORDER — PHENOL 1.4 % MT LIQD: 1.0000 | OROMUCOSAL | Status: DC | PRN

## 2021-05-21 MED ORDER — SODIUM CHLORIDE 0.9 % IV SOLN: INTRAVENOUS | Status: DC

## 2021-05-21 MED ORDER — GABAPENTIN 300 MG PO CAPS
600.0000 mg | ORAL_CAPSULE | Freq: Every evening | ORAL | Status: DC | PRN
  Administered 2021-05-21: 600 mg via ORAL
  Filled 2021-05-21: qty 2

## 2021-05-21 MED ORDER — CHLORHEXIDINE GLUCONATE 0.12 % MT SOLN
15.0000 mL | Freq: Once | OROMUCOSAL | Status: AC
  Administered 2021-05-21: 15 mL via OROMUCOSAL

## 2021-05-21 MED ORDER — EPHEDRINE SULFATE-NACL 50-0.9 MG/10ML-% IV SOSY
PREFILLED_SYRINGE | INTRAVENOUS | Status: DC | PRN
  Administered 2021-05-21 (×3): 10 mg via INTRAVENOUS
  Administered 2021-05-21 (×2): 15 mg via INTRAVENOUS

## 2021-05-21 MED ORDER — 0.9 % SODIUM CHLORIDE (POUR BTL) OPTIME
TOPICAL | Status: DC | PRN
  Administered 2021-05-21: 1000 mL

## 2021-05-21 MED ORDER — DEXAMETHASONE SODIUM PHOSPHATE 10 MG/ML IJ SOLN
8.0000 mg | Freq: Once | INTRAMUSCULAR | Status: AC
  Administered 2021-05-21: 8 mg via INTRAVENOUS

## 2021-05-21 MED ORDER — STERILE WATER FOR IRRIGATION IR SOLN
Status: DC | PRN
  Administered 2021-05-21: 2000 mL

## 2021-05-21 MED ORDER — ATORVASTATIN CALCIUM 20 MG PO TABS: 20.0000 mg | ORAL_TABLET | Freq: Every day | ORAL | Status: DC

## 2021-05-21 MED ORDER — FLEET ENEMA 7-19 GM/118ML RE ENEM: 1.0000 | ENEMA | Freq: Once | RECTAL | Status: DC | PRN

## 2021-05-21 MED ORDER — DEXAMETHASONE SODIUM PHOSPHATE 10 MG/ML IJ SOLN
10.0000 mg | Freq: Once | INTRAMUSCULAR | Status: AC
  Administered 2021-05-22: 10 mg via INTRAVENOUS
  Filled 2021-05-21: qty 1

## 2021-05-21 MED ORDER — PHENYLEPHRINE 40 MCG/ML (10ML) SYRINGE FOR IV PUSH (FOR BLOOD PRESSURE SUPPORT)
PREFILLED_SYRINGE | INTRAVENOUS | Status: DC | PRN
  Administered 2021-05-21 (×3): 80 ug via INTRAVENOUS

## 2021-05-21 MED ORDER — FENTANYL CITRATE (PF) 100 MCG/2ML IJ SOLN
INTRAMUSCULAR | Status: DC | PRN
  Administered 2021-05-21 (×2): 50 ug via INTRAVENOUS
  Administered 2021-05-21: 100 ug via INTRAVENOUS
  Administered 2021-05-21: 50 ug via INTRAVENOUS

## 2021-05-21 MED ORDER — PROPOFOL 1000 MG/100ML IV EMUL
INTRAVENOUS | Status: AC
  Filled 2021-05-21: qty 100

## 2021-05-21 MED ORDER — PHENYLEPHRINE 40 MCG/ML (10ML) SYRINGE FOR IV PUSH (FOR BLOOD PRESSURE SUPPORT)
PREFILLED_SYRINGE | INTRAVENOUS | Status: AC
  Filled 2021-05-21: qty 20

## 2021-05-21 MED ORDER — LIDOCAINE 2% (20 MG/ML) 5 ML SYRINGE
INTRAMUSCULAR | Status: DC | PRN
  Administered 2021-05-21: 60 mg via INTRAVENOUS

## 2021-05-21 MED ORDER — AMISULPRIDE (ANTIEMETIC) 5 MG/2ML IV SOLN: 10.0000 mg | Freq: Once | INTRAVENOUS | Status: DC | PRN

## 2021-05-21 MED ORDER — FENTANYL CITRATE PF 50 MCG/ML IJ SOSY
PREFILLED_SYRINGE | INTRAMUSCULAR | Status: AC
  Administered 2021-05-21: 50 ug via INTRAVENOUS
  Filled 2021-05-21: qty 3

## 2021-05-21 MED ORDER — ACETAMINOPHEN 10 MG/ML IV SOLN
1000.0000 mg | Freq: Four times a day (QID) | INTRAVENOUS | Status: AC
  Administered 2021-05-21: 1000 mg via INTRAVENOUS
  Filled 2021-05-21 (×4): qty 100

## 2021-05-21 MED ORDER — DULOXETINE HCL 60 MG PO CPEP
60.0000 mg | ORAL_CAPSULE | Freq: Every day | ORAL | Status: DC
  Administered 2021-05-22: 60 mg via ORAL
  Filled 2021-05-21: qty 1

## 2021-05-21 MED ORDER — BISACODYL 10 MG RE SUPP: 10.0000 mg | Freq: Every day | RECTAL | Status: DC | PRN

## 2021-05-21 MED ORDER — EPHEDRINE 5 MG/ML INJ
INTRAVENOUS | Status: AC
  Filled 2021-05-21: qty 15

## 2021-05-21 MED ORDER — SODIUM CHLORIDE (PF) 0.9 % IJ SOLN
INTRAMUSCULAR | Status: AC
  Filled 2021-05-21: qty 50

## 2021-05-21 MED ORDER — METOCLOPRAMIDE HCL 5 MG/ML IJ SOLN: 5.0000 mg | Freq: Three times a day (TID) | INTRAMUSCULAR | Status: DC | PRN

## 2021-05-21 MED ORDER — ACETAMINOPHEN 500 MG PO TABS
1000.0000 mg | ORAL_TABLET | Freq: Four times a day (QID) | ORAL | Status: DC
  Administered 2021-05-21 – 2021-05-22 (×3): 1000 mg via ORAL
  Filled 2021-05-21 (×3): qty 2

## 2021-05-21 MED ORDER — ONDANSETRON HCL 4 MG PO TABS: 4.0000 mg | ORAL_TABLET | Freq: Four times a day (QID) | ORAL | Status: DC | PRN

## 2021-05-21 MED ORDER — ONDANSETRON HCL 4 MG/2ML IJ SOLN
INTRAMUSCULAR | Status: AC
  Filled 2021-05-21: qty 2

## 2021-05-21 MED ORDER — FENTANYL CITRATE PF 50 MCG/ML IJ SOSY
50.0000 ug | PREFILLED_SYRINGE | INTRAMUSCULAR | Status: DC
  Administered 2021-05-21: 50 ug via INTRAVENOUS
  Filled 2021-05-21: qty 2

## 2021-05-21 MED ORDER — SODIUM CHLORIDE 0.9 % IR SOLN
Status: DC | PRN
  Administered 2021-05-21: 1000 mL

## 2021-05-21 MED ORDER — METOCLOPRAMIDE HCL 5 MG PO TABS: 5.0000 mg | ORAL_TABLET | Freq: Three times a day (TID) | ORAL | Status: DC | PRN

## 2021-05-21 MED ORDER — METHOCARBAMOL 500 MG PO TABS
500.0000 mg | ORAL_TABLET | Freq: Four times a day (QID) | ORAL | Status: DC | PRN
  Administered 2021-05-21 – 2021-05-22 (×3): 500 mg via ORAL
  Filled 2021-05-21 (×4): qty 1

## 2021-05-21 MED ORDER — METOPROLOL SUCCINATE ER 50 MG PO TB24
50.0000 mg | ORAL_TABLET | Freq: Every day | ORAL | Status: DC
  Administered 2021-05-22: 50 mg via ORAL
  Filled 2021-05-21: qty 1

## 2021-05-21 MED ORDER — SODIUM CHLORIDE (PF) 0.9 % IJ SOLN
INTRAMUSCULAR | Status: AC
  Filled 2021-05-21: qty 10

## 2021-05-21 MED ORDER — METHOCARBAMOL 500 MG IVPB - SIMPLE MED
500.0000 mg | Freq: Four times a day (QID) | INTRAVENOUS | Status: DC | PRN
  Filled 2021-05-21: qty 50

## 2021-05-21 MED ORDER — LORAZEPAM 0.5 MG PO TABS: 0.5000 mg | ORAL_TABLET | Freq: Three times a day (TID) | ORAL | Status: DC | PRN

## 2021-05-21 MED ORDER — OXYCODONE HCL 5 MG PO TABS
5.0000 mg | ORAL_TABLET | ORAL | Status: DC | PRN
  Administered 2021-05-22 (×2): 10 mg via ORAL
  Filled 2021-05-21: qty 2

## 2021-05-21 SURGICAL SUPPLY — 77 items
ADAPTER BOLT FEMORAL +2/-2 (Knees) ×1 IMPLANT
ADPR FEM +2/-2 OFST BOLT (Knees) ×1 IMPLANT
ADPR FEM 5D STRL KN PFC SGM (Orthopedic Implant) ×1 IMPLANT
AUG FEM SZ2.5 8 CMNT CMB STRL (Knees) ×2 IMPLANT
AUG TIB SZ3 10 REV STP WDG (Knees) ×2 IMPLANT
BAG COUNTER SPONGE SURGICOUNT (BAG) IMPLANT
BAG DECANTER FOR FLEXI CONT (MISCELLANEOUS) ×2 IMPLANT
BAG SPEC THK2 15X12 ZIP CLS (MISCELLANEOUS)
BAG SPNG CNTER NS LX DISP (BAG)
BAG ZIPLOCK 12X15 (MISCELLANEOUS) IMPLANT
BLADE SAG 18X100X1.27 (BLADE) ×2 IMPLANT
BLADE SAW SGTL 11.0X1.19X90.0M (BLADE) ×2 IMPLANT
BLADE SURG SZ10 CARB STEEL (BLADE) IMPLANT
BNDG ELASTIC 6X5.8 VLCR STR LF (GAUZE/BANDAGES/DRESSINGS) ×2 IMPLANT
BONE CEMENT GENTAMICIN (Cement) ×6 IMPLANT
CEMENT BONE GENTAMICIN 40 (Cement) ×3 IMPLANT
CEMENT RESTRICTOR DEPUY SZ 4 (Cement) ×1 IMPLANT
COVER SURGICAL LIGHT HANDLE (MISCELLANEOUS) ×2 IMPLANT
CUFF TOURN SGL QUICK 34 (TOURNIQUET CUFF) ×2
CUFF TRNQT CYL 34X4.125X (TOURNIQUET CUFF) ×1 IMPLANT
DRAPE INCISE IOBAN 66X45 STRL (DRAPES) ×2 IMPLANT
DRAPE U-SHAPE 47X51 STRL (DRAPES) ×2 IMPLANT
DRESSING AQUACEL AG SP 3.5X10 (GAUZE/BANDAGES/DRESSINGS) IMPLANT
DRSG ADAPTIC 3X8 NADH LF (GAUZE/BANDAGES/DRESSINGS) ×2 IMPLANT
DRSG AQUACEL AG SP 3.5X10 (GAUZE/BANDAGES/DRESSINGS) ×2
DRSG PAD ABDOMINAL 8X10 ST (GAUZE/BANDAGES/DRESSINGS) ×2 IMPLANT
DURAPREP 26ML APPLICATOR (WOUND CARE) ×2 IMPLANT
ELECT REM PT RETURN 15FT ADLT (MISCELLANEOUS) ×2 IMPLANT
EVACUATOR 1/8 PVC DRAIN (DRAIN) IMPLANT
FEMORAL ADAPTER (Orthopedic Implant) ×1 IMPLANT
FEMORAL PFC TC3 (Orthopedic Implant) ×1 IMPLANT
GAUZE SPONGE 4X4 12PLY STRL (GAUZE/BANDAGES/DRESSINGS) ×2 IMPLANT
GLOVE SRG 8 PF TXTR STRL LF DI (GLOVE) ×1 IMPLANT
GLOVE SURG ENC MOIS LTX SZ6.5 (GLOVE) ×4 IMPLANT
GLOVE SURG ENC MOIS LTX SZ8 (GLOVE) ×4 IMPLANT
GLOVE SURG UNDER POLY LF SZ7 (GLOVE) ×2 IMPLANT
GLOVE SURG UNDER POLY LF SZ8 (GLOVE) ×2
GOWN SPEC L4 XLG W/TWL (GOWN DISPOSABLE) ×4 IMPLANT
GOWN STRL REUS W/ TWL LRG LVL3 (GOWN DISPOSABLE) ×2 IMPLANT
GOWN STRL REUS W/TWL LRG LVL3 (GOWN DISPOSABLE) ×4
HANDPIECE INTERPULSE COAX TIP (DISPOSABLE) ×2
HOLDER FOLEY CATH W/STRAP (MISCELLANEOUS) IMPLANT
IMMOBILIZER KNEE 20 (SOFTGOODS) ×2
IMMOBILIZER KNEE 20 THIGH 36 (SOFTGOODS) ×1 IMPLANT
INSERT TC3 RP TIB  2.5 17.5MM (Knees) ×2 IMPLANT
INSERT TC3 RP TIB 2.5 17.5MM (Knees) IMPLANT
KIT TURNOVER KIT A (KITS) IMPLANT
MANIFOLD NEPTUNE II (INSTRUMENTS) ×2 IMPLANT
NS IRRIG 1000ML POUR BTL (IV SOLUTION) ×2 IMPLANT
PACK TOTAL KNEE CUSTOM (KITS) ×2 IMPLANT
PADDING CAST ABS 6INX4YD NS (CAST SUPPLIES) ×1
PADDING CAST ABS COTTON 6X4 NS (CAST SUPPLIES) IMPLANT
PADDING CAST COTTON 6X4 STRL (CAST SUPPLIES) ×4 IMPLANT
POST AUG PFC 8MM SZ 2.5 (Knees) ×2 IMPLANT
PROTECTOR NERVE ULNAR (MISCELLANEOUS) ×2 IMPLANT
SET HNDPC FAN SPRY TIP SCT (DISPOSABLE) ×1 IMPLANT
SPIKE FLUID TRANSFER (MISCELLANEOUS) IMPLANT
STEM TIBIA PFC 13X30MM (Stem) ×1 IMPLANT
STEM UNIVERSAL REVISION 75X12 (Stem) ×1 IMPLANT
STRIP CLOSURE SKIN 1/2X4 (GAUZE/BANDAGES/DRESSINGS) ×3 IMPLANT
SUT MNCRL AB 4-0 PS2 18 (SUTURE) ×2 IMPLANT
SUT STRATAFIX 0 PDS 27 VIOLET (SUTURE) ×2
SUT VIC AB 2-0 CT1 27 (SUTURE) ×6
SUT VIC AB 2-0 CT1 TAPERPNT 27 (SUTURE) ×3 IMPLANT
SUTURE STRATFX 0 PDS 27 VIOLET (SUTURE) ×1 IMPLANT
SWAB COLLECTION DEVICE MRSA (MISCELLANEOUS) IMPLANT
SWAB CULTURE ESWAB REG 1ML (MISCELLANEOUS) IMPLANT
SYR 50ML LL SCALE MARK (SYRINGE) ×4 IMPLANT
TOWER CARTRIDGE SMART MIX (DISPOSABLE) ×2 IMPLANT
TRAY FOLEY MTR SLVR 16FR STAT (SET/KITS/TRAYS/PACK) ×2 IMPLANT
TRAY REVISION SZ 3 (Knees) ×1 IMPLANT
TRAY SLEEVE CEM ML (Knees) ×1 IMPLANT
TUBE KAMVAC SUCTION (TUBING) IMPLANT
TUBE SUCTION HIGH CAP CLEAR NV (SUCTIONS) ×2 IMPLANT
WATER STERILE IRR 1000ML POUR (IV SOLUTION) ×2 IMPLANT
WEDGE SZ 3 10MM (Knees) ×2 IMPLANT
WRAP KNEE MAXI GEL POST OP (GAUZE/BANDAGES/DRESSINGS) ×1 IMPLANT

## 2021-05-21 NOTE — Brief Op Note (Signed)
05/21/2021 ? ?4:28 PM ? ?PATIENT:  Kimberly Andrews  71 y.o. female ? ?PRE-OPERATIVE DIAGNOSIS:  Failed left total knee arthroplasty ? ?POST-OPERATIVE DIAGNOSIS:  Failed left total knee arthroplasty ? ?PROCEDURE:  Procedure(s): ?TOTAL KNEE REVISION (Left) ? ?SURGEON:  Surgeon(s) and Role: ?   Ollen Gross, MD - Primary ? ?PHYSICIAN ASSISTANT:  ? ?ASSISTANTS: Nelia Shi, PA-C  ? ?ANESTHESIA:   regional and general ? ?EBL:  50 mL  ? ?BLOOD ADMINISTERED:none ? ?DRAINS: none  ? ?LOCAL MEDICATIONS USED:  OTHER Exparel ? ?COUNTS:  YES ? ?TOURNIQUET:   ?Total Tourniquet Time Documented: ?Thigh (Left) - 50 minutes ?Thigh (Left) - 20 minutes ?Total: Thigh (Left) - 70 minutes ? ? ?DICTATION: .Other Dictation: Dictation Number [id number] ? ?PLAN OF CARE: Admit to inpatient  ? ?PATIENT DISPOSITION:  PACU - hemodynamically stable. ?  ? ? ?

## 2021-05-21 NOTE — Progress Notes (Signed)
Assisted Dr. Rob Fitzgerald with left, ultrasound guided, adductor canal block. Side rails up, monitors on throughout procedure. See vital signs in flow sheet. Tolerated Procedure well.  

## 2021-05-21 NOTE — Op Note (Signed)
NAME: Kimberly Andrews, [name] ?MEDICAL RECORD NO: [id number] ?ACCOUNT NO: [id number] ?DATE OF BIRTH: 1950-07-18 ?FACILITY: [facility] ?LOCATION: [id number] ?PHYSICIAN: Gus RankinFrank V. Haeven Nickle, MD ? ?Operative Report  ? ?DATE OF PROCEDURE: 05/21/2021 ? ? ?PREOPERATIVE DIAGNOSIS:  Failed left total knee arthroplasty. ? ?POSTOPERATIVE DIAGNOSIS:  Failed left total knee arthroplasty. ? ?PROCEDURES:  Left total knee arthroplasty revision. ? ?SURGEON:  Gus RankinFrank V. Esiquio Boesen, MD ? ?ASSISTANT:  Nelia ShiSean Childress, PA-C ? ?ANESTHESIA:  Adductor canal block and general. ? ?ESTIMATED BLOOD LOSS:  50 mL. ? ?DRAIN:  None. ? ?TOURNIQUET TIME:  Up 50 minutes at 300 mmHg, down 8 minutes, up additional 20 minutes at 300 mmHg. ? ?COMPLICATIONS:  None. ? ?CONDITION:  Stable to recovery. ? ?BRIEF CLINICAL NOTE:  The patient is a 71 year old female who presented with severe left knee pain and deformity.  She had a total knee arthroplasty done over 15 years ago.  The knee had been given out on her.  There was swelling, has significant pain.   ?She had radiographs showing loosening of the tibial component of total knee arthroplasty with migration of the component.  She had infection workup, which was negative.  She presents now for total knee arthroplasty revision. ? ?DESCRIPTION OF PROCEDURE:  After successful administration of adductor canal block and general anesthetic, a tourniquet was placed high on her left thigh and her left lower extremity was prepped and draped in the usual sterile fashion.  Extremity was  ?wrapped in Esmarch, knee flexed and the tourniquet inflated to 300 mmHg.  Midline incision was made with a 10 blade through the subcutaneous tissue to the level of the extensor mechanism.  A fresh blade was used to make a medial parapatellar arthrotomy.  ? There was minimal fluid in the joint.  Soft tissue over the proximal medial tibia subperiosteally elevated to the joint line with a knife and into the semimembranosus bursa with a Cobb elevator.  Soft tissue  laterally was elevated with attention being  ?paid to avoiding the patellar tendon on tibial tubercle.  There was a tremendous amount of polyethylene wear debris and synovitis present.  All of this was excised back to normal tissue.  Patella was everted, knee flexed 90 degrees.  I was able to remove ? the tibial polyethylene from the tibial tray.  We subluxed the tibia forward and then we were able to remove the tibial tray without any difficulty.  It had migrated into varus and there was some bone loss medially.  There was a lot of fibrous tissue  ?present underneath where the tray was and that is all removed.  The cement is then removed from the proximal tibia also.  The extramedullary tibial alignment guide is placed, referencing proximally to medial aspect of the tibial tubercle and distally  ?along the second metatarsal axis and tibial crest.  The block is pinned to remove 2 mm off the previous cut surface.  The resection was made with an oscillating saw.  Size 3 is the most appropriate tibial component.  I then prepared the tibial canal by  ?reaming up to a 14 for placement of a 13 mm cemented stem.  We then reamed proximally for the size 3 revision tibia and did the reaming with a stem extension on there.  I prepared for a 29 sleeve with the sleeve broach.  Given that she had significant  ?bone loss, I felt she would need augments, so 10 mm augments were utilized medial and lateral. ? ?We then addressed the femur.  Osteotome was used to disrupt the interface between the femoral component on bone and bone and the component was easily removed without any bone loss.  Again, there was a lot of fibrinous tissue underneath where the  ?component was and I removed all that back to normal-appearing bone.  I accessed the femoral canal and thoroughly irrigated it.  I reamed up to 12 mm, which surprisingly at 12 had a great press-fit in the femoral canal.  We used that reamer to serve as  ?our intramedullary cutting  guide.  Distal femoral cutting block was placed to remove about 2 mm off the medial and lateral condyles distally.  Size 2.5 was the most appropriate femoral component.  We placed a size 2.5 AP cutting block and then the  ?rotation was marked off the epicondylar axis and confirmed by creating a rectangular flexion gap at 90 degrees utilizing a spacer.  Rotation was marked and a 2.5 block was pinned in that rotation.  Anteriorly, we did not get any bone posteriorly.  We had ? to go in a +8 position medial and lateral to get any bone, so we needed 8 mm posterior augments.  There was no bone removed on chamfer cuts.  The intercondylar block for the size 2.5 was placed and the cut made for the TC3 femoral component.  Trials were  ?then placed on the tibial side, it is a size 3 MBT revision tray with 10 mm augments medial and lateral, 29 sleeve and a 13 x 30 stem extension, on the femoral side it is a size 2.5 TC3 femur with a 12 x 75 stem in the +2 position in 5 degrees of valgus  ?and with 8 mm posterior augments medial and lateral.  Trials were placed and with a 15 mm insert, full extension was achieved with excellent varus valgus and AP balance throughout full range of motion.  Patelloplasty was performed to expose the patellar  ?component.  It was inset patella.  There was no evidence of wear.  The patella tracked normally.  Thus, we did not revise the patella.  The tourniquet was then released for initial tourniquet time of 50 minutes.  I kept it down for 10 minutes, and we did ? not identify any significant bleeding.  Small minor bleeding points were stopped with electrocautery.  During this time, the components were assembled on the back table once again on the tibial side, size 3 MBT revision tray, 13 x 30 stem, 10 mm  ?augments medial and lateral 29 sleeve, on the femoral side size 2.5 TC3 femur with 8 mm posterior augments medial and lateral and a 12 x 75 stem in a +2 position and 5 degrees of valgus.  After 8  minutes, the leg was wrapped in an Esmarch, tourniquet  ?reinflated to 300 mmHg.  The cut bone surfaces were then thoroughly irrigated with pulsatile lavage.  Cement was mixed and once ready for implantation, the tibial component was cemented first and impacted with all extruded cement removed.  On the femoral ? side, we cemented distally with a press-fit stem.  Extruded cement is removed.  Trial 15 insert was placed, the knee held in full extension and then any remaining extruded cement is removed.  When the cement was fully hardened then the permanent 15 mm  ?TC3 rotating platform insert was placed into the tibial tray.  The knee was reduced with outstanding stability throughout full range of motion.  Wound was copiously irrigated with saline solution.  A 20 mL of Exparel mixed with 60 mL of saline was  ?injected into the posterior capsule, the periosteum of the femur and the extensor mechanism.  Further irrigation was performed.  The permanent 15 mm insert was placed.  The knee is held in extension and then the arthrotomy closed with a running 0  ?Stratafix suture.  Tourniquet was let down for a second time of 20 minutes.  Subcutaneous was closed with interrupted 2-0 Vicryl and subcuticular running 4-0 Monocryl.  Incisions cleaned and dried, and Steri-Strips and sterile dressing applied.  The  ?patient was then awakened and transported to recovery in stable condition.  ? ? ? ?SUJ ?D: 05/21/2021 4:37:26 pm T: 05/21/2021 10:56:00 pm  ?JOB: [id number]/ [id number]  ?

## 2021-05-21 NOTE — Discharge Instructions (Signed)
? ?Kimberly Gross, MD ?Total Joint Specialist ?[facility] ?[address] ?[place], [place] [address] ?[phone] ? ?TOTAL KNEE REVISION POSTOPERATIVE DIRECTIONS ? ?Knee Rehabilitation, Guidelines Following Surgery  ?Results after knee surgery are often greatly improved when you follow the exercise, range of motion and muscle strengthening exercises prescribed by your doctor. Safety measures are also important to protect the knee from further injury. If any of these exercises cause you to have increased pain or swelling in your knee joint, decrease the amount until you are comfortable again and slowly increase them. If you have problems or questions, call your caregiver or physical therapist for advice.  ? ?BLOOD CLOT PREVENTION ?Take a 325 mg Aspirin two times a day for three weeks following surgery. Then resume one 81 mg Aspirin once a day. ?You may resume your vitamins/supplements upon discharge from the hospital. ?Do not take any NSAIDs (Advil, Aleve, Ibuprofen, Meloxicam, etc.) until you have discontinued the 325 mg Aspirin. ? ? ?HOME CARE INSTRUCTIONS  ?Remove items at home which could result in a fall. This includes throw rugs or furniture in walking pathways.  ?ICE to the affected knee as much as tolerated. Icing helps control swelling. If the swelling is well controlled you will be more comfortable and rehab easier. Continue to use ice on the knee for pain and swelling from surgery. You may notice swelling that will progress down to the foot and ankle. This is normal after surgery. Elevate the leg when you are not up walking on it.    ?Continue to use the breathing machine which will help keep your temperature down. It is common for your temperature to cycle up and down following surgery, especially at night when you are not up moving around and exerting yourself. The breathing machine keeps your lungs expanded and your temperature down. ?Do not place pillow under the operative  knee, focus on keeping the knee straight while resting ? ?DIET ?You may resume your previous home diet once you are discharged from the hospital. ? ?DRESSING / WOUND CARE / SHOWERING ?Keep your bulky bandage on for 2 days. On the third post-operative day you may remove the Ace bandage and gauze. There is a waterproof adhesive bandage on your skin which will stay in place until your first follow-up appointment. Once you remove this you will not need to place another bandage ?You may begin showering 3 days following surgery, but do not submerge the incision under water. ? ?ACTIVITY ?For the first 5 days, the key is rest and control of pain and swelling ?Do your home exercises twice a day starting on post-operative day 3. On the days you go to physical therapy, just do the home exercises once that day. ?You should rest, ice and elevate the leg for 50 minutes out of every hour. Get up and walk/stretch for 10 minutes per hour. After 5 days you can increase your activity slowly as tolerated. ?Walk with your walker as instructed. Use the walker until you are comfortable transitioning to a cane. Walk with the cane in the opposite hand of the operative leg. You may discontinue the cane once you are comfortable and walking steadily. ?Avoid periods of inactivity such as sitting longer than an hour when not asleep. This helps prevent blood clots.  ?You may discontinue the knee immobilizer once you are able to perform a straight leg raise while lying down. ?You may resume a sexual relationship in one month or when given the OK by your  doctor.  ?You may return to work once you are cleared by your doctor.  ?Do not drive a car for 6 weeks or until released by your surgeon.  ?Do not drive while taking narcotics. ? ?TED HOSE STOCKINGS ?Wear the elastic stockings on both legs for three weeks following surgery during the day. You may remove them at night for sleeping. ? ?WEIGHT BEARING ?Weight bearing as tolerated with assist device  (walker, cane, etc) as directed, use it as long as suggested by your surgeon or therapist, typically at least 4-6 weeks. ? ?POSTOPERATIVE CONSTIPATION PROTOCOL ?Constipation - defined medically as fewer than three stools per week and severe constipation as less than one stool per week. ? ?One of the most common issues patients have following surgery is constipation.  Even if you have a regular bowel pattern at home, your normal regimen is likely to be disrupted due to multiple reasons following surgery.  Combination of anesthesia, postoperative narcotics, change in appetite and fluid intake all can affect your bowels.  In order to avoid complications following surgery, here are some recommendations in order to help you during your recovery period. ? ?Colace (docusate) - Pick up an over-the-counter form of Colace or another stool softener and take twice a day as long as you are requiring postoperative pain medications.  Take with a full glass of water daily.  If you experience loose stools or diarrhea, hold the colace until you stool forms back up. If your symptoms do not get better within 1 week or if they get worse, check with your doctor. ?Dulcolax (bisacodyl) - Pick up over-the-counter and take as directed by the product packaging as needed to assist with the movement of your bowels.  Take with a full glass of water.  Use this product as needed if not relieved by Colace only.  ?MiraLax (polyethylene glycol) - Pick up over-the-counter to have on hand. MiraLax is a solution that will increase the amount of water in your bowels to assist with bowel movements.  Take as directed and can mix with a glass of water, juice, soda, coffee, or tea. Take if you go more than two days without a movement. Do not use MiraLax more than once per day. Call your doctor if you are still constipated or irregular after using this medication for 7 days in a row. ? ?If you continue to have problems with postoperative constipation, please  contact the office for further assistance and recommendations.  If you experience "the worst abdominal pain ever" or develop nausea or vomiting, please contact the office immediatly for further recommendations for treatment. ? ?ITCHING ?If you experience itching with your medications, try taking only a single pain pill, or even half a pain pill at a time.  You can also use Benadryl over the counter for itching or also to help with sleep.  ? ?MEDICATIONS ?See your medication summary on the ?After Visit Summary? that the nursing staff will review with you prior to discharge.  You may have some home medications which will be placed on hold until you complete the course of blood thinner medication.  It is important for you to complete the blood thinner medication as prescribed by your surgeon.  Continue your approved medications as instructed at time of discharge. ? ?PRECAUTIONS ?If you experience chest pain or shortness of breath - call 911 immediately for transfer to the hospital emergency department.  ?If you develop a fever greater that 101 F, purulent drainage from wound, increased redness or  drainage from wound, foul odor from the wound/dressing, or calf pain - CONTACT YOUR SURGEON.   ?                                                ?FOLLOW-UP APPOINTMENTS ?Make sure you keep all of your appointments after your operation with your surgeon and caregivers. You should call the office at the above phone number and make an appointment for approximately two weeks after the date of your surgery or on the date instructed by your surgeon outlined in the "After Visit Summary". ? ?RANGE OF MOTION AND STRENGTHENING EXERCISES  ?Rehabilitation of the knee is important following a knee injury or an operation. After just a few days of immobilization, the muscles of the thigh which control the knee become weakened and shrink (atrophy). Knee exercises are designed to build up the tone and strength of the thigh muscles and to  improve knee motion. Often times heat used for twenty to thirty minutes before working out will loosen up your tissues and help with improving the range of motion but do not use heat for the first two weeks

## 2021-05-21 NOTE — Anesthesia Procedure Notes (Signed)
Procedure Name: LMA Insertion ?Date/Time: 05/21/2021 2:44 PM ?Performed by: Kizzie Fantasia, CRNA ?Pre-anesthesia Checklist: Patient identified, Emergency Drugs available, Suction available, Patient being monitored and Timeout performed ?Patient Re-evaluated:Patient Re-evaluated prior to induction ?Oxygen Delivery Method: Circle system utilized ?Preoxygenation: Pre-oxygenation with 100% oxygen ?Induction Type: IV induction ?Ventilation: Mask ventilation without difficulty ?LMA: LMA inserted ?LMA Size: 4.0 ?Number of attempts: 1 ?Placement Confirmation: positive ETCO2 and breath sounds checked- equal and bilateral ?Tube secured with: Tape ?Dental Injury: Teeth and Oropharynx as per pre-operative assessment  ? ? ? ? ?

## 2021-05-21 NOTE — Anesthesia Preprocedure Evaluation (Addendum)
Anesthesia Evaluation  ?Patient identified by MRN, date of birth, ID band ?Patient awake ? ? ? ?Reviewed: ?Allergy & Precautions, NPO status , Patient's Chart, lab work & pertinent test results ? ?Airway ?Mallampati: III ? ?TM Distance: >3 FB ?Neck ROM: Full ? ? ? Dental ? ?(+) Dental Advisory Given ?  ?Pulmonary ?sleep apnea ,  ?  ?breath sounds clear to auscultation ? ? ? ? ? ? Cardiovascular ?hypertension, Pt. on medications and Pt. on home beta blockers ? ?Rhythm:Regular Rate:Normal ? ? ?  ?Neuro/Psych ? Neuromuscular disease   ? GI/Hepatic ?negative GI ROS, Neg liver ROS,   ?Endo/Other  ?negative endocrine ROS ? Renal/GU ?CRFRenal disease  ? ?  ?Musculoskeletal ? ?(+) Arthritis ,  ? Abdominal ?  ?Peds ? Hematology ?negative hematology ROS ?(+)   ?Anesthesia Other Findings ? ? Reproductive/Obstetrics ? ?  ? ? ? ? ? ? ? ? ? ? ? ? ? ?  ?  ? ? ? ? ? ? ? ? ?Lab Results  ?Component Value Date  ? WBC 9.3 05/19/2021  ? HGB 13.7 05/19/2021  ? HCT 42.7 05/19/2021  ? MCV 82.0 05/19/2021  ? PLT 357 05/19/2021  ? ?Lab Results  ?Component Value Date  ? CREATININE 1.09 (H) 05/19/2021  ? BUN 17 05/19/2021  ? NA 140 05/19/2021  ? K 4.6 05/19/2021  ? CL 104 05/19/2021  ? CO2 25 05/19/2021  ? ? ?Anesthesia Physical ?Anesthesia Plan ? ?ASA: 2 ? ?Anesthesia Plan: General  ? ?Post-op Pain Management: Regional block* and Ofirmev IV (intra-op)*  ? ?Induction: Intravenous ? ?PONV Risk Score and Plan: 3 and Dexamethasone, Ondansetron, Treatment may vary due to age or medical condition and Propofol infusion ? ?Airway Management Planned: Oral ETT and LMA ? ?Additional Equipment: None ? ?Intra-op Plan:  ? ?Post-operative Plan: Extubation in OR ? ?Informed Consent: I have reviewed the patients History and Physical, chart, labs and discussed the procedure including the risks, benefits and alternatives for the proposed anesthesia with the patient or authorized representative who has indicated his/her  understanding and acceptance.  ? ? ? ?Dental advisory given ? ?Plan Discussed with:  ? ?Anesthesia Plan Comments: (Pt declined SAB 2/2 previous spinal fusion.)  ? ? ? ? ?Anesthesia Quick Evaluation ? ?

## 2021-05-21 NOTE — Plan of Care (Signed)

## 2021-05-21 NOTE — Anesthesia Procedure Notes (Signed)
Anesthesia Regional Block: Adductor canal block  ? ?Pre-Anesthetic Checklist: , timeout performed,  Correct Patient, Correct Site, Correct Laterality,  Correct Procedure, Correct Position, site marked,  Risks and benefits discussed,  Surgical consent,  Pre-op evaluation,  At surgeon's request and post-op pain management ? ?Laterality: Left ? ?Prep: chloraprep     ?  ?Needles:  ?Injection technique: Single-shot ? ?Needle Type: Echogenic Needle   ? ? ?Needle Length: 9cm  ?Needle Gauge: 21  ? ? ? ?Additional Needles: ? ? ?Procedures:,,,, ultrasound used (permanent image in chart),,    ?Narrative:  ?Start time: 05/21/2021 1:22 PM ?End time: 05/21/2021 1:27 PM ?Injection made incrementally with aspirations every 5 mL. ? ?Performed by: Personally  ?Anesthesiologist: Marcene Duos, MD ? ? ? ? ?

## 2021-05-21 NOTE — Transfer of Care (Signed)
Immediate Anesthesia Transfer of Care Note ? ?Patient: Kimberly Andrews ? ?Procedure(s) Performed: TOTAL KNEE REVISION (Left: Knee) ? ?Patient Location: PACU ? ?Anesthesia Type:General ? ?Level of Consciousness: awake, alert  and oriented ? ?Airway & Oxygen Therapy: Patient Spontanous Breathing and Patient connected to face mask oxygen ? ?Post-op Assessment: Report given to RN and Post -op Vital signs reviewed and stable ? ?Post vital signs: Reviewed and stable ? ?Last Vitals:  ?Vitals Value Taken Time  ?BP 159/70 05/21/21 1651  ?Temp    ?Pulse 74 05/21/21 1700  ?Resp 23 05/21/21 1701  ?SpO2 100 % 05/21/21 1700  ?Vitals shown include unvalidated device data. ? ?Last Pain:  ?Vitals:  ? 05/21/21 1214  ?TempSrc:   ?PainSc: 1   ?   ? ?  ? ?Complications: No notable events documented. ?

## 2021-05-22 ENCOUNTER — Encounter (HOSPITAL_COMMUNITY): Payer: Self-pay | Admitting: Orthopedic Surgery

## 2021-05-22 LAB — CBC
HCT: 35.8 % — ABNORMAL LOW (ref 36.0–46.0)
Hemoglobin: 11.3 g/dL — ABNORMAL LOW (ref 12.0–15.0)
MCH: 25.9 pg — ABNORMAL LOW (ref 26.0–34.0)
MCHC: 31.6 g/dL (ref 30.0–36.0)
MCV: 82.1 fL (ref 80.0–100.0)
Platelets: 274 10*3/uL (ref 150–400)
RBC: 4.36 MIL/uL (ref 3.87–5.11)
RDW: 13.6 % (ref 11.5–15.5)
WBC: 13.9 10*3/uL — ABNORMAL HIGH (ref 4.0–10.5)
nRBC: 0 % (ref 0.0–0.2)

## 2021-05-22 LAB — BASIC METABOLIC PANEL
Anion gap: 6 (ref 5–15)
BUN: 21 mg/dL (ref 8–23)
CO2: 24 mmol/L (ref 22–32)
Calcium: 8.4 mg/dL — ABNORMAL LOW (ref 8.9–10.3)
Chloride: 105 mmol/L (ref 98–111)
Creatinine, Ser: 1.07 mg/dL — ABNORMAL HIGH (ref 0.44–1.00)
GFR, Estimated: 56 mL/min — ABNORMAL LOW (ref >60)
Glucose, Bld: 205 mg/dL — ABNORMAL HIGH (ref 70–99)
Potassium: 4.7 mmol/L (ref 3.5–5.1)
Sodium: 135 mmol/L (ref 135–145)

## 2021-05-22 MED ORDER — METHOCARBAMOL 500 MG PO TABS
500.0000 mg | ORAL_TABLET | Freq: Four times a day (QID) | ORAL | 0 refills | Status: AC | PRN
Start: 2021-05-22 — End: ?

## 2021-05-22 MED ORDER — ASPIRIN 325 MG PO TBEC: 325.0000 mg | DELAYED_RELEASE_TABLET | Freq: Two times a day (BID) | ORAL | 0 refills | Status: AC

## 2021-05-22 MED ORDER — OXYCODONE HCL 5 MG PO TABS: 5.0000 mg | ORAL_TABLET | Freq: Four times a day (QID) | ORAL | 0 refills | Status: AC | PRN

## 2021-05-22 NOTE — Progress Notes (Signed)
? ?  Subjective: ?1 Day Post-Op Procedure(s) (LRB): ?TOTAL KNEE REVISION (Left) ?Patient reports pain as mild.   ?Patient seen in rounds by Dr. Lequita Halt. ?Patient is well, and has had no acute complaints or problems other than pain in the left knee. Denies chest pain or SOB. No issues overnight. Voiding without difficulty. ?We will begin therapy today.  ? ?Objective: ?Vital signs in last 24 hours: ?Temp:  [97.8 ?F (36.6 ?C)-99.5 ?F (37.5 ?C)] 98.2 ?F (36.8 ?C) (03/16 0519) ?Pulse Rate:  [63-81] 64 (03/16 0519) ?Resp:  [9-26] 16 (03/16 0519) ?BP: (124-187)/(68-97) 125/68 (03/16 0519) ?SpO2:  [88 %-100 %] 97 % (03/16 0519) ?Weight:  [72.6 kg] 72.6 kg (03/15 1159) ? ?Intake/Output from previous day: ? ?Intake/Output Summary (Last 24 hours) at 05/22/2021 0723 ?Last data filed at 05/22/2021 9892 ?Gross per 24 hour  ?Intake 3445.07 ml  ?Output 50 ml  ?Net 3395.07 ml  ?  ? ?Intake/Output this shift: ?No intake/output data recorded. ? ?Labs: ?Recent Labs  ?  05/19/21 ?1194 05/22/21 ?0323  ?HGB 13.7 11.3*  ? ?Recent Labs  ?  05/19/21 ?1740 05/22/21 ?0323  ?WBC 9.3 13.9*  ?RBC 5.21* 4.36  ?HCT 42.7 35.8*  ?PLT 357 274  ? ?Recent Labs  ?  05/19/21 ?8144 05/22/21 ?0323  ?NA 140 135  ?K 4.6 4.7  ?CL 104 105  ?CO2 25 24  ?BUN 17 21  ?CREATININE 1.09* 1.07*  ?GLUCOSE 115* 205*  ?CALCIUM 9.3 8.4*  ? ?Recent Labs  ?  05/19/21 ?8185  ?INR 1.0  ? ? ?Exam: ?General - Patient is Alert and Oriented ?Extremity - Neurologically intact ?Neurovascular intact ?Sensation intact distally ?Dorsiflexion/Plantar flexion intact ?Dressing - dressing C/D/I ?Motor Function - intact, moving foot and toes well on exam.  ? ?Past Medical History:  ?Diagnosis Date  ? Anxiety   ? Arthritis   ? Chronic kidney disease   ? Depression   ? Heart murmur   ? History of kidney stones   ? Hypertension   ? Pre-diabetes   ? Sleep apnea   ? does not use cpap  ? ? ?Assessment/Plan: ?1 Day Post-Op Procedure(s) (LRB): ?TOTAL KNEE REVISION (Left) ?Principal Problem: ?  Failed  total left knee replacement (HCC) ? ?Estimated body mass index is 33.45 kg/m? as calculated from the following: ?  Height as of this encounter: 4\' 10"  (1.473 m). ?  Weight as of this encounter: 72.6 kg. ?Advance diet ?Up with therapy ?D/C IV fluids ? ?DVT Prophylaxis - Aspirin ?Weight bearing as tolerated. ?Begin therapy. ? ?Plan is to go Home after hospital stay. ? ?Possible discharge this afternoon if doing exceptionally well and meeting goals with therapy. Otherwise, will stay until tomorrow.  ?Scheduled for OPPT at [facility]. ?Follow-up in the office in 2 weeks. ? ?The PDMP database was reviewed today prior to any opioid medications being prescribed to this patient. ?[facility], PA-C ?Orthopedic Surgery ?[phone] Arther Abbott ?05/22/2021, 7:23 AM ? ?

## 2021-05-22 NOTE — Plan of Care (Signed)

## 2021-05-22 NOTE — TOC Transition Note (Signed)
Transition of Care (TOC) - CM/SW Discharge Note ? ?Patient Details  ?Name: Kimberly Andrews ?MRN: [id number] ?Date of Birth: May 12, 1950 ? ?Transition of Care (TOC) CM/SW Contact:  ?Sherie Don, LCSW ?Phone Number: ?05/22/2021, 9:25 AM ? ?Clinical Narrative: Patient is expected to discharge home after working with PT. CSW met with patient to confirm discharge plan. Patient will go home with OPPT at [facility]. Patient has a rolling walker, cane, 3N1, and shower bench at home so there are no DME needs at this time. TOC signing off. ? ?Final next level of care: OP Rehab ?Barriers to Discharge: No Barriers Identified ? ?Patient Goals and CMS Choice ?Patient states their goals for this hospitalization and ongoing recovery are:: Discharge home with OPPT at [facility] PT ?Choice offered to / list presented to : NA ? ?Discharge Plan and Services       ?DME Arranged: N/A ?DME Agency: NA ? ?Readmission Risk Interventions ?No flowsheet data found. ? ?

## 2021-05-22 NOTE — Progress Notes (Signed)
Physical Therapy Treatment ?Patient Details ?Name: Kimberly Andrews ?MRN: [id number] ?DOB: 1950-03-26 ?Today's Date: 05/22/2021 ? ? ?History of Present Illness Pt s/p L TKR revision and with hx of CKD, back surgery and bil TKR ? ?  ?PT Comments  ? ? Pt in good spirits and progressing well with mobility.  Pt up to ambulate increased distance in hall and performed HEP with written instruction provided and reviewed.  Pt eager for dc home.   ?Recommendations for follow up therapy are one component of a multi-disciplinary discharge planning process, led by the attending physician.  Recommendations may be updated based on patient status, additional functional criteria and insurance authorization. ? ?Follow Up Recommendations ? Outpatient PT ?  ?  ?Assistance Recommended at Discharge Intermittent Supervision/Assistance  ?Patient can return home with the following A little help with walking and/or transfers;A little help with bathing/dressing/bathroom;Assist for transportation;Help with stairs or ramp for entrance;Assistance with cooking/housework ?  ?Equipment Recommendations ? None recommended by PT  ?  ?Recommendations for Other Services   ? ? ?  ?Precautions / Restrictions Precautions ?Precautions: Knee;Fall ?Required Braces or Orthoses: Knee Immobilizer - Left ?Knee Immobilizer - Left: Discontinue once straight leg raise with < 10 degree lag (pt performed IND SLR this date) ?Restrictions ?Weight Bearing Restrictions: No ?LLE Weight Bearing: Weight bearing as tolerated  ?  ? ?Mobility ? Bed Mobility ?Overal bed mobility: Needs Assistance ?Bed Mobility: Supine to Sit ?  ?  ?Supine to sit: Min guard ?  ?  ?General bed mobility comments: Pt up in chair and requests back to same ?  ? ?Transfers ?Overall transfer level: Needs assistance ?Equipment used: Rolling walker (2 wheels) ?Transfers: Sit to/from Stand ?Sit to Stand: Min guard, Supervision ?  ?  ?  ?  ?  ?General transfer comment: cues for LE management and use of UEs to  self assist ?  ? ?Ambulation/Gait ?Ambulation/Gait assistance: Min guard, Supervision ?Gait Distance (Feet): 180 Feet ?Assistive device: Rolling walker (2 wheels) ?Gait Pattern/deviations: Step-to pattern, Step-through pattern, Decreased step length - right, Decreased step length - left, Shuffle ?Gait velocity: decr ?  ?  ?General Gait Details: cues for posture, position from RW and initial sequence ? ? ?Stairs ?  ?  ?  ?  ?  ? ? ?Wheelchair Mobility ?  ? ?Modified Rankin (Stroke Patients Only) ?  ? ? ?  ?Balance Overall balance assessment: Mild deficits observed, not formally tested ?  ?  ?  ?  ?  ?  ?  ?  ?  ?  ?  ?  ?  ?  ?  ?  ?  ?  ?  ? ?  ?Cognition Arousal/Alertness: Awake/alert ?Behavior During Therapy: [facility] for tasks assessed/performed ?Overall Cognitive Status: Within Functional Limits for tasks assessed ?  ?  ?  ?  ?  ?  ?  ?  ?  ?  ?  ?  ?  ?  ?  ?  ?  ?  ?  ? ?  ?Exercises Total Joint Exercises ?Ankle Circles/Pumps: AROM, Both, 15 reps, Supine ?Quad Sets: AROM, Both, 10 reps, Supine ?Heel Slides: AAROM, Left, 10 reps, Supine ?Hip ABduction/ADduction: AAROM, AROM, Left, 10 reps, Supine ?Straight Leg Raises: AAROM, AROM, Left, 10 reps, Supine ?Long Arc Quad: AAROM, Left, 5 reps, Seated ? ?  ?General Comments   ?  ?  ? ?Pertinent Vitals/Pain Pain Assessment ?Pain Assessment: 0-10 ?Pain Score: 5  ?Pain Location: L knee ?Pain Descriptors /  Indicators: Aching, Sore ?Pain Intervention(s): Limited activity within patient's tolerance, Monitored during session, Premedicated before session, Ice applied  ? ? ?Home Living Family/patient expects to be discharged to:: Private residence ?Living Arrangements: Spouse/significant other ?Available Help at Discharge: Family ?Type of Home: House ?Home Access: Ramped entrance ?  ?  ?  ?Home Layout: One level ?Home Equipment: Agricultural consultant (2 wheels);Cane - single point;BSC/3in1 ?   ?  ?Prior Function    ?  ?  ?   ? ?PT Goals (current goals can now be found in the care plan  section) Acute Rehab PT Goals ?Patient Stated Goal: Regain IND ?PT Goal Formulation: With patient ?Time For Goal Achievement: 05/29/21 ?Potential to Achieve Goals: Good ?Progress towards PT goals: Progressing toward goals ? ?  ?Frequency ? ? ? 7X/week ? ? ? ?  ?PT Plan Current plan remains appropriate  ? ? ?Co-evaluation   ?  ?  ?  ?  ? ?  ?AM-PAC PT "6 Clicks" Mobility   ?Outcome Measure ? Help needed turning from your back to your side while in a flat bed without using bedrails?: A Little ?Help needed moving from lying on your back to sitting on the side of a flat bed without using bedrails?: A Little ?Help needed moving to and from a bed to a chair (including a wheelchair)?: A Little ?Help needed standing up from a chair using your arms (e.g., wheelchair or bedside chair)?: A Little ?Help needed to walk in hospital room?: A Little ?Help needed climbing 3-5 steps with a railing? : A Little ?6 Click Score: 18 ? ?  ?End of Session Equipment Utilized During Treatment: Gait belt ?Activity Tolerance: Patient tolerated treatment well ?Patient left: in chair;with call bell/phone within reach ?Nurse Communication: Mobility status ?PT Visit Diagnosis: Difficulty in walking, not elsewhere classified (R26.2) ?  ? ? ?Time: 9678-9381 ?PT Time Calculation (min) (ACUTE ONLY): 28 min ? ?Charges:  $Gait Training: 8-22 mins ?$Therapeutic Exercise: 8-22 mins          ?          ? ?Mauro Kaufmann PT ?Acute Rehabilitation Services ?Pager [phone] ?Office [phone] ? ? ? ?Draya Felker ?05/22/2021, 11:32 AM ? ?

## 2021-05-22 NOTE — Evaluation (Signed)
Physical Therapy Evaluation ?Patient Details ?Name: Kimberly Andrews ?MRN: [id number] ?DOB: Feb 23, 1951 ?Today's Date: 05/22/2021 ? ?History of Present Illness ? Pt s/p L TKR revision and with hx of CKD, back surgery and bil TKR  ?Clinical Impression ? Pt s/p L TKR revision and presents with decreased L LE strength/ROM and post op pain limiting functional mobility.  Pt should progress well to dc home with family assist and reports first OP PT scheduled for 3/20//23.   ?   ? ?Recommendations for follow up therapy are one component of a multi-disciplinary discharge planning process, led by the attending physician.  Recommendations may be updated based on patient status, additional functional criteria and insurance authorization. ? ?Follow Up Recommendations Outpatient PT ? ?  ?Assistance Recommended at Discharge Intermittent Supervision/Assistance  ?Patient can return home with the following ? A little help with walking and/or transfers;A little help with bathing/dressing/bathroom;Assist for transportation;Help with stairs or ramp for entrance;Assistance with cooking/housework ? ?  ?Equipment Recommendations None recommended by PT  ?Recommendations for Other Services ?    ?  ?Functional Status Assessment Patient has had a recent decline in their functional status and demonstrates the ability to make significant improvements in function in a reasonable and predictable amount of time.  ? ?  ?Precautions / Restrictions Precautions ?Precautions: Knee;Fall ?Required Braces or Orthoses: Knee Immobilizer - Left ?Knee Immobilizer - Left: Discontinue once straight leg raise with < 10 degree lag ?Restrictions ?Weight Bearing Restrictions: No ?LLE Weight Bearing: Weight bearing as tolerated  ? ?  ? ?Mobility ? Bed Mobility ?Overal bed mobility: Needs Assistance ?Bed Mobility: Supine to Sit ?  ?  ?Supine to sit: Min guard ?  ?  ?General bed mobility comments: min guard for L LE ?  ? ?Transfers ?Overall transfer level: Needs  assistance ?Equipment used: Rolling walker (2 wheels) ?Transfers: Sit to/from Stand ?Sit to Stand: Min assist, Min guard ?  ?  ?  ?  ?  ?General transfer comment: cues for LE management and use of UEs to self assist ?  ? ?Ambulation/Gait ?Ambulation/Gait assistance: Min assist ?Gait Distance (Feet): 120 Feet ?Assistive device: Rolling walker (2 wheels) ?Gait Pattern/deviations: Step-to pattern, Step-through pattern, Decreased step length - right, Decreased step length - left, Shuffle ?Gait velocity: decr ?  ?  ?General Gait Details: cues for posture, position from RW and initial sequence ? ?Stairs ?  ?  ?  ?  ?  ? ?Wheelchair Mobility ?  ? ?Modified Rankin (Stroke Patients Only) ?  ? ?  ? ?Balance Overall balance assessment: Mild deficits observed, not formally tested ?  ?  ?  ?  ?  ?  ?  ?  ?  ?  ?  ?  ?  ?  ?  ?  ?  ?  ?   ? ? ? ?Pertinent Vitals/Pain Pain Assessment ?Pain Assessment: 0-10 ?Pain Score: 5  ?Pain Location: L knee ?Pain Descriptors / Indicators: Aching, Sore ?Pain Intervention(s): Limited activity within patient's tolerance, Monitored during session, Patient requesting pain meds-RN notified, RN gave pain meds during session, Ice applied  ? ? ?Home Living Family/patient expects to be discharged to:: Private residence ?Living Arrangements: Spouse/significant other ?Available Help at Discharge: Family ?Type of Home: House ?Home Access: Ramped entrance ?  ?  ?  ?Home Layout: One level ?Home Equipment: Agricultural consultant (2 wheels);Cane - single point;BSC/3in1 ?   ?  ?Prior Function Prior Level of Function : Independent/Modified Independent ?  ?  ?  ?  ?  ?  ?  Mobility Comments: used cane as needed ?  ?  ? ? ?Hand Dominance  ?   ? ?  ?Extremity/Trunk Assessment  ? Upper Extremity Assessment ?Upper Extremity Assessment: Overall WFL for tasks assessed ?  ? ?Lower Extremity Assessment ?Lower Extremity Assessment: LLE deficits/detail ?LLE Deficits / Details: 3/5 quads with IND SLR; AAROM at knee -5 - 80 ?   ? ?Cervical / Trunk Assessment ?Cervical / Trunk Assessment: Normal  ?Communication  ? Communication: No difficulties  ?Cognition Arousal/Alertness: Awake/alert ?Behavior During Therapy: [facility] for tasks assessed/performed ?Overall Cognitive Status: Within Functional Limits for tasks assessed ?  ?  ?  ?  ?  ?  ?  ?  ?  ?  ?  ?  ?  ?  ?  ?  ?  ?  ?  ? ?  ?General Comments   ? ?  ?Exercises Total Joint Exercises ?Ankle Circles/Pumps: AROM, Both, 15 reps, Supine ?Quad Sets: AROM, Both, 10 reps, Supine ?Heel Slides: AAROM, Left, 10 reps, Supine ?Straight Leg Raises: AAROM, AROM, Left, 10 reps, Supine  ? ?Assessment/Plan  ?  ?PT Assessment Patient needs continued PT services  ?PT Problem List Decreased strength;Decreased range of motion;Decreased activity tolerance;Decreased balance;Decreased mobility;Decreased knowledge of use of DME;Pain ? ?   ?  ?PT Treatment Interventions DME instruction;Gait training;Stair training;Functional mobility training;Therapeutic activities;Therapeutic exercise;Patient/family education   ? ?PT Goals (Current goals can be found in the Care Plan section)  ?Acute Rehab PT Goals ?Patient Stated Goal: Regain IND ?PT Goal Formulation: With patient ?Time For Goal Achievement: 05/29/21 ?Potential to Achieve Goals: Good ? ?  ?Frequency 7X/week ?  ? ? ?Co-evaluation   ?  ?  ?  ?  ? ? ?  ?AM-PAC PT "6 Clicks" Mobility  ?Outcome Measure Help needed turning from your back to your side while in a flat bed without using bedrails?: A Little ?Help needed moving from lying on your back to sitting on the side of a flat bed without using bedrails?: A Little ?Help needed moving to and from a bed to a chair (including a wheelchair)?: A Little ?Help needed standing up from a chair using your arms (e.g., wheelchair or bedside chair)?: A Little ?Help needed to walk in hospital room?: A Little ?Help needed climbing 3-5 steps with a railing? : A Lot ?6 Click Score: 17 ? ?  ?End of Session Equipment Utilized During  Treatment: Gait belt ?Activity Tolerance: Patient tolerated treatment well ?Patient left: in chair;with call bell/phone within reach ?Nurse Communication: Mobility status ?PT Visit Diagnosis: Difficulty in walking, not elsewhere classified (R26.2) ?  ? ?Time: 8937-3428 ?PT Time Calculation (min) (ACUTE ONLY): 32 min ? ? ?Charges:   PT Evaluation ?$PT Eval Low Complexity: 1 Low ?PT Treatments ?$Therapeutic Exercise: 8-22 mins ?  ?   ? ? ?Mauro Kaufmann PT ?Acute Rehabilitation Services ?Pager [phone] ?Office [phone] ? ? ?Mahealani Sulak ?05/22/2021, 9:15 AM ? ?

## 2021-05-23 DIAGNOSIS — R051 Acute cough: Secondary | ICD-10-CM | POA: Diagnosis not present

## 2021-05-23 DIAGNOSIS — Z20822 Contact with and (suspected) exposure to covid-19: Secondary | ICD-10-CM | POA: Diagnosis not present

## 2021-05-23 DIAGNOSIS — R059 Cough, unspecified: Secondary | ICD-10-CM | POA: Diagnosis not present

## 2021-05-23 NOTE — Anesthesia Postprocedure Evaluation (Signed)
Anesthesia Post Note ? ?Patient: Kimberly Andrews ? ?Procedure(s) Performed: TOTAL KNEE REVISION (Left: Knee) ? ?  ? ?Patient location during evaluation: PACU ?Anesthesia Type: General ?Level of consciousness: awake and alert ?Pain management: pain level controlled ?Vital Signs Assessment: post-procedure vital signs reviewed and stable ?Respiratory status: spontaneous breathing, nonlabored ventilation, respiratory function stable and patient connected to nasal cannula oxygen ?Cardiovascular status: blood pressure returned to baseline and stable ?Postop Assessment: no apparent nausea or vomiting ?Anesthetic complications: no ? ? ?No notable events documented. ? ?Last Vitals:  ?Vitals:  ? 05/22/21 1050 05/22/21 1130  ?BP: 126/68   ?Pulse: 62 67  ?Resp:  20  ?Temp:  37 ?C  ?SpO2:  94%  ?  ?Last Pain:  ?Vitals:  ? 05/22/21 1352  ?TempSrc:   ?PainSc: 6   ? ? ?  ?  ?  ?  ?  ?  ? ?Marcene Duos E ? ? ? ? ?

## 2021-05-23 NOTE — Discharge Summary (Signed)
Physician Discharge Summary  ? ?Patient ID: ?Kimberly Andrews ?MRN: [id number] ?DOB/AGE: 72-Nov-1952 72 y.o. ? ?Admit date: 05/21/2021 ?Discharge date: 05/22/2021 ? ?Primary Diagnosis: Failed left total knee arthroplasty  ? ?Admission Diagnoses:  ?Past Medical History:  ?Diagnosis Date  ? Anxiety   ? Arthritis   ? Chronic kidney disease   ? Depression   ? Heart murmur   ? History of kidney stones   ? Hypertension   ? Pre-diabetes   ? Sleep apnea   ? does not use cpap  ? ?Discharge Diagnoses:   ?Principal Problem: ?  Failed total left knee replacement (HCC) ? ?Estimated body mass index is 33.45 kg/m? as calculated from the following: ?  Height as of this encounter: 4\' 10"  (1.473 m). ?  Weight as of this encounter: 72.6 kg. ? ?Procedure:  ?Procedure(s) (LRB): ?TOTAL KNEE REVISION (Left)  ? ?Consults: None ? ?HPI: The patient is a 71 year old female who presented with severe left knee pain and deformity.  She had a total knee arthroplasty done over 15 years ago.  The knee had been given out on her.  There was swelling, has significant pain.   ?She had radiographs showing loosening of the tibial component of total knee arthroplasty with migration of the component.  She had infection workup, which was negative.  She presents now for total knee arthroplasty revision. ? ?Laboratory Data: ?Admission on 05/21/2021, Discharged on 05/22/2021  ?Component Date Value Ref Range Status  ? WBC 05/22/2021 13.9 (H)  4.0 - 10.5 K/uL Final  ? RBC 05/22/2021 4.36  3.87 - 5.11 MIL/uL Final  ? Hemoglobin 05/22/2021 11.3 (L)  12.0 - 15.0 g/dL Final  ? HCT 05/24/2021 35.8 (L)  36.0 - 46.0 % Final  ? MCV 05/22/2021 82.1  80.0 - 100.0 fL Final  ? MCH 05/22/2021 25.9 (L)  26.0 - 34.0 pg Final  ? MCHC 05/22/2021 31.6  30.0 - 36.0 g/dL Final  ? RDW 05/24/2021 13.6  11.5 - 15.5 % Final  ? Platelets 05/22/2021 274  150 - 400 K/uL Final  ? nRBC 05/22/2021 0.0  0.0 - 0.2 % Final  ? Performed at [facility], [address],  [place], [place]  ? Sodium 05/22/2021 135  135 - 145 mmol/L Final  ? Potassium 05/22/2021 4.7  3.5 - 5.1 mmol/L Final  ? Chloride 05/22/2021 105  98 - 111 mmol/L Final  ? CO2 05/22/2021 24  22 - 32 mmol/L Final  ? Glucose, Bld 05/22/2021 205 (H)  70 - 99 mg/dL Final  ? Glucose reference range applies only to samples taken after fasting for at least 8 hours.  ? BUN 05/22/2021 21  8 - 23 mg/dL Final  ? Creatinine, Ser 05/22/2021 1.07 (H)  0.44 - 1.00 mg/dL Final  ? Calcium 05/24/2021 8.4 (L)  8.9 - 10.3 mg/dL Final  ? GFR, Estimated 05/22/2021 56 (L)  >60 mL/min Final  ? Comment: (NOTE) ?Calculated using the CKD-EPI Creatinine Equation (2021) ?  ? Anion gap 05/22/2021 6  5 - 15 Final  ? Performed at [facility] [address] [place], [place]  ?Hospital Outpatient Visit on 05/19/2021  ?Component Date Value Ref Range Status  ? WBC 05/19/2021 9.3  4.0 - 10.5 K/uL Final  ? RBC 05/19/2021 5.21 (H)  3.87 - 5.11 MIL/uL Final  ? Hemoglobin 05/19/2021 13.7  12.0 - 15.0 g/dL Final  ? HCT 05/21/2021 42.7  36.0 - 46.0 % Final  ?  MCV 05/19/2021 82.0  80.0 - 100.0 fL Final  ? MCH 05/19/2021 26.3  26.0 - 34.0 pg Final  ? MCHC 05/19/2021 32.1  30.0 - 36.0 g/dL Final  ? RDW 12/45/8099 13.8  11.5 - 15.5 % Final  ? Platelets 05/19/2021 357  150 - 400 K/uL Final  ? nRBC 05/19/2021 0.0  0.0 - 0.2 % Final  ? Performed at [facility], [address], [place], [place] [address]  ? Sodium 05/19/2021 140  135 - 145 mmol/L Final  ? Potassium 05/19/2021 4.6  3.5 - 5.1 mmol/L Final  ? Chloride 05/19/2021 104  98 - 111 mmol/L Final  ? CO2 05/19/2021 25  22 - 32 mmol/L Final  ? Glucose, Bld 05/19/2021 115 (H)  70 - 99 mg/dL Final  ? Glucose reference range applies only to samples taken after fasting for at least 8 hours.  ? BUN 05/19/2021 17  8 - 23 mg/dL Final  ? Creatinine, Ser 05/19/2021 1.09 (H)  0.44 - 1.00 mg/dL Final  ? Calcium 50/53/9767 9.3  8.9 - 10.3 mg/dL Final  ? Total Protein  05/19/2021 7.6  6.5 - 8.1 g/dL Final  ? Albumin 34/19/3790 3.9  3.5 - 5.0 g/dL Final  ? AST 24/11/7351 20  15 - 41 U/L Final  ? ALT 05/19/2021 21  0 - 44 U/L Final  ? Alkaline Phosphatase 05/19/2021 65  38 - 126 U/L Final  ? Total Bilirubin 05/19/2021 0.2 (L)  0.3 - 1.2 mg/dL Final  ? GFR, Estimated 05/19/2021 54 (L)  >60 mL/min Final  ? Comment: (NOTE) ?Calculated using the CKD-EPI Creatinine Equation (2021) ?  ? Anion gap 05/19/2021 11  5 - 15 Final  ? Performed at [facility], [address], [place], [place] [address]  ? ABO/RH(D) 05/19/2021 A POS   Final  ? Antibody Screen 05/19/2021 NEG   Final  ? Sample Expiration 05/19/2021 05/24/2021,2359   Final  ? Extend sample reason 05/19/2021    Final  ?                 Value:NO TRANSFUSIONS OR PREGNANCY IN THE PAST 3 MONTHS ?Performed at [facility], [address] [place], [place] [address] ?  ? Prothrombin Time 05/19/2021 13.0  11.4 - 15.2 seconds Final  ? INR 05/19/2021 1.0  0.8 - 1.2 Final  ? Comment: (NOTE) ?INR goal varies based on device and disease states. ?Performed at [facility], [address] ?[place], [place] [address] ?  ? MRSA, PCR 05/19/2021 NEGATIVE  NEGATIVE Final  ? Staphylococcus aureus 05/19/2021 NEGATIVE  NEGATIVE Final  ? Comment: (NOTE) ?The Xpert SA Assay (FDA approved for NASAL specimens in patients 72 ?years of age and older), is one component of a comprehensive ?surveillance program. It is not intended to diagnose infection nor to ?guide or monitor treatment. ?Performed at [facility], [address] ?[place], [place] [address] ?  ? Hgb A1c MFr Bld 05/19/2021 6.0 (H)  4.8 - 5.6 % Final  ? Comment: (NOTE) ?        Prediabetes: 5.7 - 6.4 ?        Diabetes: >6.4 ?        Glycemic control for adults with diabetes: <7.0 ?  ? Mean Plasma Glucose 05/19/2021 126  mg/dL Final  ? Comment: (NOTE) ?Performed At: [facility] ?[address] [place], [place]  [id number] ?Jolene Schimke MD YC:[id number] ?  ?Hospital Outpatient  Visit on 05/19/2021  ?Component Date Value Ref Range Status  ? SARS Coronavirus 2 05/19/2021 NEGATIVE  NEGATIVE Final  ? Comment: (NOTE) ?SARS-CoV-2 target nucleic acids are NOT DETECTED. ? ?The SARS-CoV-2 RNA is generally detectable in upper and lower ?respiratory specimens during the acute phase of infection. Negative ?results do not preclude SARS-CoV-2 infection, do not rule out ?co-infections with other pathogens, and should not be used as the ?sole basis for treatment or other patient management decisions. ?Negative results must be combined with clinical observations, ?patient history, and epidemiological information. The expected ?result is Negative. ? ?Fact Sheet for Patients: ?HairSlick.nohttps://www.fda.gov/media/138098/download ? ?Fact Sheet for Healthcare Providers: ?quierodirigir.comhttps://www.fda.gov/media/138095/download ? ?This test is not yet approved or cleared by the [place] FDA and  ?has been authorized for detection and/or diagnosis of SARS-CoV-2 by ?FDA under an Emergency Use Authorization (EUA). This EUA will remain  ?in effect (meaning this test can be used) for the duration of the ?COVID-19 declaration under Se  ?                        ction 564(b)(1) of the Act, 21 U.S.C. ?section 360bbb-3(b)(1), unless the authorization is terminated or ?revoked sooner. ? ?Performed at [facility], [address] [place], [place] ?[id number] ?  ? Glucose-Capillary 05/19/2021 117 (H)  70 - 99 mg/dL Final  ? Glucose reference range applies only to samples taken after fasting for at least 8 hours.  ?  ? ?X-Rays:No results found. ? ?EKG: ?Orders placed or performed during the hospital encounter of 05/19/21  ? EKG 12 lead per protocol  ? EKG 12 lead per protocol  ?  ? ?Hospital Course: Kimberly GipDanielle M Dennin is a 71 y.o. who was [facility] to [facility]. They were brought to the operating room on 05/21/2021 and underwent Procedure(s): ?TOTAL KNEE REVISION.   Patient tolerated the procedure well and was later transferred to the recovery room and then to the orthopaedic floor for postoperative care. They were given PO and IV analgesics for pain control following thei

## 2021-05-26 DIAGNOSIS — R2689 Other abnormalities of gait and mobility: Secondary | ICD-10-CM | POA: Diagnosis not present

## 2021-05-26 DIAGNOSIS — M25562 Pain in left knee: Secondary | ICD-10-CM | POA: Diagnosis not present

## 2021-05-26 DIAGNOSIS — M25462 Effusion, left knee: Secondary | ICD-10-CM | POA: Diagnosis not present

## 2021-05-26 DIAGNOSIS — M6281 Muscle weakness (generalized): Secondary | ICD-10-CM | POA: Diagnosis not present

## 2021-05-26 DIAGNOSIS — M25662 Stiffness of left knee, not elsewhere classified: Secondary | ICD-10-CM | POA: Diagnosis not present

## 2021-05-27 DIAGNOSIS — R300 Dysuria: Secondary | ICD-10-CM | POA: Diagnosis not present

## 2021-05-28 DIAGNOSIS — N390 Urinary tract infection, site not specified: Secondary | ICD-10-CM | POA: Diagnosis not present

## 2021-06-02 DIAGNOSIS — M25562 Pain in left knee: Secondary | ICD-10-CM | POA: Diagnosis not present

## 2021-06-02 DIAGNOSIS — R2689 Other abnormalities of gait and mobility: Secondary | ICD-10-CM | POA: Diagnosis not present

## 2021-06-02 DIAGNOSIS — M6281 Muscle weakness (generalized): Secondary | ICD-10-CM | POA: Diagnosis not present

## 2021-06-02 DIAGNOSIS — M25462 Effusion, left knee: Secondary | ICD-10-CM | POA: Diagnosis not present

## 2021-06-02 DIAGNOSIS — M25662 Stiffness of left knee, not elsewhere classified: Secondary | ICD-10-CM | POA: Diagnosis not present

## 2021-06-04 DIAGNOSIS — R2689 Other abnormalities of gait and mobility: Secondary | ICD-10-CM | POA: Diagnosis not present

## 2021-06-04 DIAGNOSIS — M25462 Effusion, left knee: Secondary | ICD-10-CM | POA: Diagnosis not present

## 2021-06-04 DIAGNOSIS — M25562 Pain in left knee: Secondary | ICD-10-CM | POA: Diagnosis not present

## 2021-06-04 DIAGNOSIS — M25662 Stiffness of left knee, not elsewhere classified: Secondary | ICD-10-CM | POA: Diagnosis not present

## 2021-06-04 DIAGNOSIS — M6281 Muscle weakness (generalized): Secondary | ICD-10-CM | POA: Diagnosis not present

## 2021-06-11 DIAGNOSIS — R2689 Other abnormalities of gait and mobility: Secondary | ICD-10-CM | POA: Diagnosis not present

## 2021-06-11 DIAGNOSIS — M25662 Stiffness of left knee, not elsewhere classified: Secondary | ICD-10-CM | POA: Diagnosis not present

## 2021-06-11 DIAGNOSIS — M25562 Pain in left knee: Secondary | ICD-10-CM | POA: Diagnosis not present

## 2021-06-11 DIAGNOSIS — M25462 Effusion, left knee: Secondary | ICD-10-CM | POA: Diagnosis not present

## 2021-06-11 DIAGNOSIS — M6281 Muscle weakness (generalized): Secondary | ICD-10-CM | POA: Diagnosis not present

## 2021-06-12 DIAGNOSIS — Z20822 Contact with and (suspected) exposure to covid-19: Secondary | ICD-10-CM | POA: Diagnosis not present

## 2021-06-17 DIAGNOSIS — M25562 Pain in left knee: Secondary | ICD-10-CM | POA: Diagnosis not present

## 2021-06-17 DIAGNOSIS — M6281 Muscle weakness (generalized): Secondary | ICD-10-CM | POA: Diagnosis not present

## 2021-06-17 DIAGNOSIS — R2689 Other abnormalities of gait and mobility: Secondary | ICD-10-CM | POA: Diagnosis not present

## 2021-06-17 DIAGNOSIS — M25462 Effusion, left knee: Secondary | ICD-10-CM | POA: Diagnosis not present

## 2021-06-17 DIAGNOSIS — M25662 Stiffness of left knee, not elsewhere classified: Secondary | ICD-10-CM | POA: Diagnosis not present

## 2021-06-23 DIAGNOSIS — M6281 Muscle weakness (generalized): Secondary | ICD-10-CM | POA: Diagnosis not present

## 2021-06-23 DIAGNOSIS — M25462 Effusion, left knee: Secondary | ICD-10-CM | POA: Diagnosis not present

## 2021-06-23 DIAGNOSIS — R2689 Other abnormalities of gait and mobility: Secondary | ICD-10-CM | POA: Diagnosis not present

## 2021-06-23 DIAGNOSIS — M25562 Pain in left knee: Secondary | ICD-10-CM | POA: Diagnosis not present

## 2021-06-23 DIAGNOSIS — M25662 Stiffness of left knee, not elsewhere classified: Secondary | ICD-10-CM | POA: Diagnosis not present

## 2021-06-25 DIAGNOSIS — M25662 Stiffness of left knee, not elsewhere classified: Secondary | ICD-10-CM | POA: Diagnosis not present

## 2021-06-25 DIAGNOSIS — R2689 Other abnormalities of gait and mobility: Secondary | ICD-10-CM | POA: Diagnosis not present

## 2021-06-25 DIAGNOSIS — M25462 Effusion, left knee: Secondary | ICD-10-CM | POA: Diagnosis not present

## 2021-06-25 DIAGNOSIS — M25562 Pain in left knee: Secondary | ICD-10-CM | POA: Diagnosis not present

## 2021-06-25 DIAGNOSIS — M6281 Muscle weakness (generalized): Secondary | ICD-10-CM | POA: Diagnosis not present

## 2021-06-30 DIAGNOSIS — M25462 Effusion, left knee: Secondary | ICD-10-CM | POA: Diagnosis not present

## 2021-06-30 DIAGNOSIS — M6281 Muscle weakness (generalized): Secondary | ICD-10-CM | POA: Diagnosis not present

## 2021-06-30 DIAGNOSIS — R2689 Other abnormalities of gait and mobility: Secondary | ICD-10-CM | POA: Diagnosis not present

## 2021-06-30 DIAGNOSIS — M25562 Pain in left knee: Secondary | ICD-10-CM | POA: Diagnosis not present

## 2021-06-30 DIAGNOSIS — M25662 Stiffness of left knee, not elsewhere classified: Secondary | ICD-10-CM | POA: Diagnosis not present

## 2021-07-01 DIAGNOSIS — Z4789 Encounter for other orthopedic aftercare: Secondary | ICD-10-CM | POA: Diagnosis not present

## 2021-07-07 DIAGNOSIS — M25562 Pain in left knee: Secondary | ICD-10-CM | POA: Diagnosis not present

## 2021-07-07 DIAGNOSIS — Z20822 Contact with and (suspected) exposure to covid-19: Secondary | ICD-10-CM | POA: Diagnosis not present

## 2021-07-07 DIAGNOSIS — M6281 Muscle weakness (generalized): Secondary | ICD-10-CM | POA: Diagnosis not present

## 2021-07-07 DIAGNOSIS — M25462 Effusion, left knee: Secondary | ICD-10-CM | POA: Diagnosis not present

## 2021-07-07 DIAGNOSIS — R2689 Other abnormalities of gait and mobility: Secondary | ICD-10-CM | POA: Diagnosis not present

## 2021-07-07 DIAGNOSIS — M25662 Stiffness of left knee, not elsewhere classified: Secondary | ICD-10-CM | POA: Diagnosis not present

## 2021-07-14 DIAGNOSIS — M25462 Effusion, left knee: Secondary | ICD-10-CM | POA: Diagnosis not present

## 2021-07-14 DIAGNOSIS — M6281 Muscle weakness (generalized): Secondary | ICD-10-CM | POA: Diagnosis not present

## 2021-07-14 DIAGNOSIS — R2689 Other abnormalities of gait and mobility: Secondary | ICD-10-CM | POA: Diagnosis not present

## 2021-07-14 DIAGNOSIS — Z20822 Contact with and (suspected) exposure to covid-19: Secondary | ICD-10-CM | POA: Diagnosis not present

## 2021-07-14 DIAGNOSIS — M25562 Pain in left knee: Secondary | ICD-10-CM | POA: Diagnosis not present

## 2021-07-14 DIAGNOSIS — M25662 Stiffness of left knee, not elsewhere classified: Secondary | ICD-10-CM | POA: Diagnosis not present

## 2021-07-16 DIAGNOSIS — M25462 Effusion, left knee: Secondary | ICD-10-CM | POA: Diagnosis not present

## 2021-07-16 DIAGNOSIS — R2689 Other abnormalities of gait and mobility: Secondary | ICD-10-CM | POA: Diagnosis not present

## 2021-07-16 DIAGNOSIS — M6281 Muscle weakness (generalized): Secondary | ICD-10-CM | POA: Diagnosis not present

## 2021-07-16 DIAGNOSIS — M25662 Stiffness of left knee, not elsewhere classified: Secondary | ICD-10-CM | POA: Diagnosis not present

## 2021-07-16 DIAGNOSIS — M25562 Pain in left knee: Secondary | ICD-10-CM | POA: Diagnosis not present

## 2021-07-21 DIAGNOSIS — R2689 Other abnormalities of gait and mobility: Secondary | ICD-10-CM | POA: Diagnosis not present

## 2021-07-21 DIAGNOSIS — M25462 Effusion, left knee: Secondary | ICD-10-CM | POA: Diagnosis not present

## 2021-07-21 DIAGNOSIS — M25662 Stiffness of left knee, not elsewhere classified: Secondary | ICD-10-CM | POA: Diagnosis not present

## 2021-07-21 DIAGNOSIS — M6281 Muscle weakness (generalized): Secondary | ICD-10-CM | POA: Diagnosis not present

## 2021-07-21 DIAGNOSIS — M25562 Pain in left knee: Secondary | ICD-10-CM | POA: Diagnosis not present

## 2021-07-29 DIAGNOSIS — I10 Essential (primary) hypertension: Secondary | ICD-10-CM | POA: Diagnosis not present

## 2021-07-29 DIAGNOSIS — Z6833 Body mass index (BMI) 33.0-33.9, adult: Secondary | ICD-10-CM | POA: Diagnosis not present

## 2021-07-29 DIAGNOSIS — J329 Chronic sinusitis, unspecified: Secondary | ICD-10-CM | POA: Diagnosis not present

## 2021-07-29 DIAGNOSIS — I700 Atherosclerosis of aorta: Secondary | ICD-10-CM | POA: Diagnosis not present

## 2021-07-29 DIAGNOSIS — R7303 Prediabetes: Secondary | ICD-10-CM | POA: Diagnosis not present

## 2021-07-29 DIAGNOSIS — Z79899 Other long term (current) drug therapy: Secondary | ICD-10-CM | POA: Diagnosis not present

## 2021-07-29 DIAGNOSIS — N1831 Chronic kidney disease, stage 3a: Secondary | ICD-10-CM | POA: Diagnosis not present

## 2021-07-29 DIAGNOSIS — F419 Anxiety disorder, unspecified: Secondary | ICD-10-CM | POA: Diagnosis not present

## 2021-07-29 DIAGNOSIS — E785 Hyperlipidemia, unspecified: Secondary | ICD-10-CM | POA: Diagnosis not present

## 2021-07-29 DIAGNOSIS — M109 Gout, unspecified: Secondary | ICD-10-CM | POA: Diagnosis not present

## 2021-10-27 DIAGNOSIS — M7062 Trochanteric bursitis, left hip: Secondary | ICD-10-CM | POA: Diagnosis not present

## 2021-10-27 DIAGNOSIS — M1612 Unilateral primary osteoarthritis, left hip: Secondary | ICD-10-CM | POA: Diagnosis not present

## 2021-11-19 DIAGNOSIS — I10 Essential (primary) hypertension: Secondary | ICD-10-CM | POA: Diagnosis not present

## 2021-11-19 DIAGNOSIS — I700 Atherosclerosis of aorta: Secondary | ICD-10-CM | POA: Diagnosis not present

## 2021-11-19 DIAGNOSIS — E785 Hyperlipidemia, unspecified: Secondary | ICD-10-CM | POA: Diagnosis not present

## 2021-11-19 DIAGNOSIS — Z6833 Body mass index (BMI) 33.0-33.9, adult: Secondary | ICD-10-CM | POA: Diagnosis not present

## 2021-11-19 DIAGNOSIS — M109 Gout, unspecified: Secondary | ICD-10-CM | POA: Diagnosis not present

## 2021-11-19 DIAGNOSIS — R7303 Prediabetes: Secondary | ICD-10-CM | POA: Diagnosis not present

## 2021-11-19 DIAGNOSIS — Z79899 Other long term (current) drug therapy: Secondary | ICD-10-CM | POA: Diagnosis not present

## 2021-11-19 DIAGNOSIS — M1991 Primary osteoarthritis, unspecified site: Secondary | ICD-10-CM | POA: Diagnosis not present

## 2021-11-19 DIAGNOSIS — F419 Anxiety disorder, unspecified: Secondary | ICD-10-CM | POA: Diagnosis not present

## 2021-11-21 DIAGNOSIS — M25552 Pain in left hip: Secondary | ICD-10-CM | POA: Diagnosis not present

## 2021-11-21 DIAGNOSIS — Z96652 Presence of left artificial knee joint: Secondary | ICD-10-CM | POA: Diagnosis not present

## 2021-12-03 DIAGNOSIS — M810 Age-related osteoporosis without current pathological fracture: Secondary | ICD-10-CM | POA: Diagnosis not present

## 2021-12-10 DIAGNOSIS — M25552 Pain in left hip: Secondary | ICD-10-CM | POA: Diagnosis not present

## 2022-01-20 DIAGNOSIS — F419 Anxiety disorder, unspecified: Secondary | ICD-10-CM | POA: Diagnosis not present

## 2022-01-20 DIAGNOSIS — Z6833 Body mass index (BMI) 33.0-33.9, adult: Secondary | ICD-10-CM | POA: Diagnosis not present

## 2022-01-20 DIAGNOSIS — G4700 Insomnia, unspecified: Secondary | ICD-10-CM | POA: Diagnosis not present

## 2022-04-09 DIAGNOSIS — Z0000 Encounter for general adult medical examination without abnormal findings: Secondary | ICD-10-CM | POA: Diagnosis not present

## 2022-04-09 DIAGNOSIS — Z6835 Body mass index (BMI) 35.0-35.9, adult: Secondary | ICD-10-CM | POA: Diagnosis not present

## 2022-04-09 DIAGNOSIS — Z1331 Encounter for screening for depression: Secondary | ICD-10-CM | POA: Diagnosis not present

## 2022-04-09 DIAGNOSIS — R413 Other amnesia: Secondary | ICD-10-CM | POA: Diagnosis not present

## 2022-04-09 DIAGNOSIS — R4789 Other speech disturbances: Secondary | ICD-10-CM | POA: Diagnosis not present

## 2022-04-09 DIAGNOSIS — I700 Atherosclerosis of aorta: Secondary | ICD-10-CM | POA: Diagnosis not present

## 2022-04-09 DIAGNOSIS — I10 Essential (primary) hypertension: Secondary | ICD-10-CM | POA: Diagnosis not present

## 2022-04-09 DIAGNOSIS — E785 Hyperlipidemia, unspecified: Secondary | ICD-10-CM | POA: Diagnosis not present

## 2022-04-09 DIAGNOSIS — R479 Unspecified speech disturbances: Secondary | ICD-10-CM | POA: Diagnosis not present

## 2022-04-09 DIAGNOSIS — M810 Age-related osteoporosis without current pathological fracture: Secondary | ICD-10-CM | POA: Diagnosis not present

## 2022-04-09 DIAGNOSIS — R7303 Prediabetes: Secondary | ICD-10-CM | POA: Diagnosis not present

## 2022-04-09 DIAGNOSIS — M109 Gout, unspecified: Secondary | ICD-10-CM | POA: Diagnosis not present

## 2022-04-09 DIAGNOSIS — Z79899 Other long term (current) drug therapy: Secondary | ICD-10-CM | POA: Diagnosis not present

## 2022-04-09 DIAGNOSIS — Z1159 Encounter for screening for other viral diseases: Secondary | ICD-10-CM | POA: Diagnosis not present

## 2022-04-28 DIAGNOSIS — Z1231 Encounter for screening mammogram for malignant neoplasm of breast: Secondary | ICD-10-CM | POA: Diagnosis not present

## 2022-04-28 DIAGNOSIS — R413 Other amnesia: Secondary | ICD-10-CM | POA: Diagnosis not present

## 2022-04-28 DIAGNOSIS — R479 Unspecified speech disturbances: Secondary | ICD-10-CM | POA: Diagnosis not present

## 2022-05-21 DIAGNOSIS — F419 Anxiety disorder, unspecified: Secondary | ICD-10-CM | POA: Diagnosis not present

## 2022-05-21 DIAGNOSIS — I10 Essential (primary) hypertension: Secondary | ICD-10-CM | POA: Diagnosis not present

## 2022-05-21 DIAGNOSIS — N1832 Chronic kidney disease, stage 3b: Secondary | ICD-10-CM | POA: Diagnosis not present

## 2022-05-21 DIAGNOSIS — Z6835 Body mass index (BMI) 35.0-35.9, adult: Secondary | ICD-10-CM | POA: Diagnosis not present

## 2022-07-08 DIAGNOSIS — M19011 Primary osteoarthritis, right shoulder: Secondary | ICD-10-CM | POA: Diagnosis not present

## 2022-07-15 DIAGNOSIS — R52 Pain, unspecified: Secondary | ICD-10-CM | POA: Diagnosis not present

## 2022-07-15 DIAGNOSIS — E559 Vitamin D deficiency, unspecified: Secondary | ICD-10-CM | POA: Diagnosis not present

## 2022-07-15 DIAGNOSIS — M79609 Pain in unspecified limb: Secondary | ICD-10-CM | POA: Diagnosis not present

## 2022-07-15 DIAGNOSIS — Z79899 Other long term (current) drug therapy: Secondary | ICD-10-CM | POA: Diagnosis not present

## 2022-07-15 DIAGNOSIS — Z01818 Encounter for other preprocedural examination: Secondary | ICD-10-CM | POA: Diagnosis not present

## 2022-07-17 DIAGNOSIS — M25511 Pain in right shoulder: Secondary | ICD-10-CM | POA: Diagnosis not present

## 2022-07-17 DIAGNOSIS — M75121 Complete rotator cuff tear or rupture of right shoulder, not specified as traumatic: Secondary | ICD-10-CM | POA: Diagnosis not present

## 2022-07-17 DIAGNOSIS — M19011 Primary osteoarthritis, right shoulder: Secondary | ICD-10-CM | POA: Diagnosis not present

## 2022-08-14 DIAGNOSIS — Z87440 Personal history of urinary (tract) infections: Secondary | ICD-10-CM | POA: Diagnosis not present

## 2022-08-14 DIAGNOSIS — Z7401 Bed confinement status: Secondary | ICD-10-CM | POA: Diagnosis not present

## 2022-08-14 DIAGNOSIS — Z96652 Presence of left artificial knee joint: Secondary | ICD-10-CM | POA: Diagnosis not present

## 2022-08-14 DIAGNOSIS — R609 Edema, unspecified: Secondary | ICD-10-CM | POA: Diagnosis not present

## 2022-08-14 DIAGNOSIS — M6281 Muscle weakness (generalized): Secondary | ICD-10-CM | POA: Diagnosis not present

## 2022-08-14 DIAGNOSIS — M7989 Other specified soft tissue disorders: Secondary | ICD-10-CM | POA: Diagnosis not present

## 2022-08-14 DIAGNOSIS — R509 Fever, unspecified: Secondary | ICD-10-CM | POA: Diagnosis not present

## 2022-08-14 DIAGNOSIS — G8929 Other chronic pain: Secondary | ICD-10-CM | POA: Diagnosis not present

## 2022-08-14 DIAGNOSIS — Z6834 Body mass index (BMI) 34.0-34.9, adult: Secondary | ICD-10-CM | POA: Diagnosis not present

## 2022-08-14 DIAGNOSIS — I10 Essential (primary) hypertension: Secondary | ICD-10-CM | POA: Diagnosis not present

## 2022-08-14 DIAGNOSIS — Z7982 Long term (current) use of aspirin: Secondary | ICD-10-CM | POA: Diagnosis not present

## 2022-08-14 DIAGNOSIS — Z87442 Personal history of urinary calculi: Secondary | ICD-10-CM | POA: Diagnosis not present

## 2022-08-14 DIAGNOSIS — M25572 Pain in left ankle and joints of left foot: Secondary | ICD-10-CM | POA: Diagnosis not present

## 2022-08-14 DIAGNOSIS — W19XXXD Unspecified fall, subsequent encounter: Secondary | ICD-10-CM | POA: Diagnosis not present

## 2022-08-14 DIAGNOSIS — S82252A Displaced comminuted fracture of shaft of left tibia, initial encounter for closed fracture: Secondary | ICD-10-CM | POA: Diagnosis not present

## 2022-08-14 DIAGNOSIS — M1A9XX0 Chronic gout, unspecified, without tophus (tophi): Secondary | ICD-10-CM | POA: Diagnosis not present

## 2022-08-14 DIAGNOSIS — R2689 Other abnormalities of gait and mobility: Secondary | ICD-10-CM | POA: Diagnosis not present

## 2022-08-14 DIAGNOSIS — E669 Obesity, unspecified: Secondary | ICD-10-CM | POA: Diagnosis not present

## 2022-08-14 DIAGNOSIS — Z888 Allergy status to other drugs, medicaments and biological substances status: Secondary | ICD-10-CM | POA: Diagnosis not present

## 2022-08-14 DIAGNOSIS — Z7901 Long term (current) use of anticoagulants: Secondary | ICD-10-CM | POA: Diagnosis not present

## 2022-08-14 DIAGNOSIS — I2510 Atherosclerotic heart disease of native coronary artery without angina pectoris: Secondary | ICD-10-CM | POA: Diagnosis not present

## 2022-08-14 DIAGNOSIS — R0781 Pleurodynia: Secondary | ICD-10-CM | POA: Diagnosis not present

## 2022-08-14 DIAGNOSIS — S82202A Unspecified fracture of shaft of left tibia, initial encounter for closed fracture: Secondary | ICD-10-CM | POA: Diagnosis not present

## 2022-08-14 DIAGNOSIS — W19XXXA Unspecified fall, initial encounter: Secondary | ICD-10-CM | POA: Diagnosis not present

## 2022-08-14 DIAGNOSIS — I5032 Chronic diastolic (congestive) heart failure: Secondary | ICD-10-CM | POA: Diagnosis not present

## 2022-08-14 DIAGNOSIS — Z880 Allergy status to penicillin: Secondary | ICD-10-CM | POA: Diagnosis not present

## 2022-08-14 DIAGNOSIS — N1832 Chronic kidney disease, stage 3b: Secondary | ICD-10-CM | POA: Diagnosis not present

## 2022-08-14 DIAGNOSIS — S82202D Unspecified fracture of shaft of left tibia, subsequent encounter for closed fracture with routine healing: Secondary | ICD-10-CM | POA: Diagnosis not present

## 2022-08-14 DIAGNOSIS — E1143 Type 2 diabetes mellitus with diabetic autonomic (poly)neuropathy: Secondary | ICD-10-CM | POA: Diagnosis not present

## 2022-08-14 DIAGNOSIS — M75121 Complete rotator cuff tear or rupture of right shoulder, not specified as traumatic: Secondary | ICD-10-CM | POA: Diagnosis not present

## 2022-08-14 DIAGNOSIS — R2681 Unsteadiness on feet: Secondary | ICD-10-CM | POA: Diagnosis not present

## 2022-08-14 DIAGNOSIS — Z743 Need for continuous supervision: Secondary | ICD-10-CM | POA: Diagnosis not present

## 2022-08-14 DIAGNOSIS — M1990 Unspecified osteoarthritis, unspecified site: Secondary | ICD-10-CM | POA: Diagnosis not present

## 2022-08-14 DIAGNOSIS — S82402D Unspecified fracture of shaft of left fibula, subsequent encounter for closed fracture with routine healing: Secondary | ICD-10-CM | POA: Diagnosis not present

## 2022-08-14 DIAGNOSIS — I130 Hypertensive heart and chronic kidney disease with heart failure and stage 1 through stage 4 chronic kidney disease, or unspecified chronic kidney disease: Secondary | ICD-10-CM | POA: Diagnosis not present

## 2022-08-14 DIAGNOSIS — Z79899 Other long term (current) drug therapy: Secondary | ICD-10-CM | POA: Diagnosis not present

## 2022-08-14 DIAGNOSIS — M810 Age-related osteoporosis without current pathological fracture: Secondary | ICD-10-CM | POA: Diagnosis not present

## 2022-08-14 DIAGNOSIS — F419 Anxiety disorder, unspecified: Secondary | ICD-10-CM | POA: Diagnosis not present

## 2022-08-14 DIAGNOSIS — S82402A Unspecified fracture of shaft of left fibula, initial encounter for closed fracture: Secondary | ICD-10-CM | POA: Diagnosis not present

## 2022-08-14 DIAGNOSIS — G8918 Other acute postprocedural pain: Secondary | ICD-10-CM | POA: Diagnosis not present

## 2022-08-14 DIAGNOSIS — M109 Gout, unspecified: Secondary | ICD-10-CM | POA: Diagnosis not present

## 2022-08-14 DIAGNOSIS — Z96651 Presence of right artificial knee joint: Secondary | ICD-10-CM | POA: Diagnosis not present

## 2022-08-14 DIAGNOSIS — R54 Age-related physical debility: Secondary | ICD-10-CM | POA: Diagnosis not present

## 2022-08-14 DIAGNOSIS — F32A Depression, unspecified: Secondary | ICD-10-CM | POA: Diagnosis not present

## 2022-08-14 DIAGNOSIS — D6851 Activated protein C resistance: Secondary | ICD-10-CM | POA: Diagnosis not present

## 2022-08-15 DIAGNOSIS — M75121 Complete rotator cuff tear or rupture of right shoulder, not specified as traumatic: Secondary | ICD-10-CM | POA: Diagnosis not present

## 2022-08-16 DIAGNOSIS — R509 Fever, unspecified: Secondary | ICD-10-CM | POA: Diagnosis not present

## 2022-08-18 DIAGNOSIS — S82202A Unspecified fracture of shaft of left tibia, initial encounter for closed fracture: Secondary | ICD-10-CM | POA: Diagnosis not present

## 2022-08-20 DIAGNOSIS — R54 Age-related physical debility: Secondary | ICD-10-CM | POA: Diagnosis not present

## 2022-08-20 DIAGNOSIS — S82202D Unspecified fracture of shaft of left tibia, subsequent encounter for closed fracture with routine healing: Secondary | ICD-10-CM | POA: Diagnosis not present

## 2022-08-20 DIAGNOSIS — R262 Difficulty in walking, not elsewhere classified: Secondary | ICD-10-CM | POA: Diagnosis not present

## 2022-08-20 DIAGNOSIS — M6281 Muscle weakness (generalized): Secondary | ICD-10-CM | POA: Diagnosis not present

## 2022-08-20 DIAGNOSIS — M810 Age-related osteoporosis without current pathological fracture: Secondary | ICD-10-CM | POA: Diagnosis not present

## 2022-08-20 DIAGNOSIS — W19XXXD Unspecified fall, subsequent encounter: Secondary | ICD-10-CM | POA: Diagnosis not present

## 2022-08-20 DIAGNOSIS — E1143 Type 2 diabetes mellitus with diabetic autonomic (poly)neuropathy: Secondary | ICD-10-CM | POA: Diagnosis not present

## 2022-08-20 DIAGNOSIS — M1A9XX0 Chronic gout, unspecified, without tophus (tophi): Secondary | ICD-10-CM | POA: Diagnosis not present

## 2022-08-20 DIAGNOSIS — I10 Essential (primary) hypertension: Secondary | ICD-10-CM | POA: Diagnosis not present

## 2022-08-20 DIAGNOSIS — R2681 Unsteadiness on feet: Secondary | ICD-10-CM | POA: Diagnosis not present

## 2022-08-20 DIAGNOSIS — D6851 Activated protein C resistance: Secondary | ICD-10-CM | POA: Diagnosis not present

## 2022-08-20 DIAGNOSIS — Z7401 Bed confinement status: Secondary | ICD-10-CM | POA: Diagnosis not present

## 2022-08-20 DIAGNOSIS — I130 Hypertensive heart and chronic kidney disease with heart failure and stage 1 through stage 4 chronic kidney disease, or unspecified chronic kidney disease: Secondary | ICD-10-CM | POA: Diagnosis not present

## 2022-08-20 DIAGNOSIS — S82202A Unspecified fracture of shaft of left tibia, initial encounter for closed fracture: Secondary | ICD-10-CM | POA: Diagnosis not present

## 2022-08-20 DIAGNOSIS — R2689 Other abnormalities of gait and mobility: Secondary | ICD-10-CM | POA: Diagnosis not present

## 2022-08-20 DIAGNOSIS — G8918 Other acute postprocedural pain: Secondary | ICD-10-CM | POA: Diagnosis not present

## 2022-08-20 DIAGNOSIS — S82402D Unspecified fracture of shaft of left fibula, subsequent encounter for closed fracture with routine healing: Secondary | ICD-10-CM | POA: Diagnosis not present

## 2022-08-21 DIAGNOSIS — G8918 Other acute postprocedural pain: Secondary | ICD-10-CM | POA: Diagnosis not present

## 2022-08-21 DIAGNOSIS — R262 Difficulty in walking, not elsewhere classified: Secondary | ICD-10-CM | POA: Diagnosis not present

## 2022-08-21 DIAGNOSIS — S82202D Unspecified fracture of shaft of left tibia, subsequent encounter for closed fracture with routine healing: Secondary | ICD-10-CM | POA: Diagnosis not present

## 2022-08-21 DIAGNOSIS — M810 Age-related osteoporosis without current pathological fracture: Secondary | ICD-10-CM | POA: Diagnosis not present

## 2022-09-01 DIAGNOSIS — S82202D Unspecified fracture of shaft of left tibia, subsequent encounter for closed fracture with routine healing: Secondary | ICD-10-CM | POA: Diagnosis not present

## 2022-09-02 DIAGNOSIS — Z79899 Other long term (current) drug therapy: Secondary | ICD-10-CM | POA: Diagnosis not present

## 2022-09-02 DIAGNOSIS — I10 Essential (primary) hypertension: Secondary | ICD-10-CM | POA: Diagnosis not present

## 2022-09-02 DIAGNOSIS — F419 Anxiety disorder, unspecified: Secondary | ICD-10-CM | POA: Diagnosis not present

## 2022-09-03 DIAGNOSIS — H9193 Unspecified hearing loss, bilateral: Secondary | ICD-10-CM | POA: Diagnosis not present

## 2022-09-03 DIAGNOSIS — I6521 Occlusion and stenosis of right carotid artery: Secondary | ICD-10-CM | POA: Diagnosis not present

## 2022-09-03 DIAGNOSIS — D6851 Activated protein C resistance: Secondary | ICD-10-CM | POA: Diagnosis not present

## 2022-09-03 DIAGNOSIS — M109 Gout, unspecified: Secondary | ICD-10-CM | POA: Diagnosis not present

## 2022-09-03 DIAGNOSIS — N1832 Chronic kidney disease, stage 3b: Secondary | ICD-10-CM | POA: Diagnosis not present

## 2022-09-03 DIAGNOSIS — N841 Polyp of cervix uteri: Secondary | ICD-10-CM | POA: Diagnosis not present

## 2022-09-03 DIAGNOSIS — Z9181 History of falling: Secondary | ICD-10-CM | POA: Diagnosis not present

## 2022-09-03 DIAGNOSIS — M80062D Age-related osteoporosis with current pathological fracture, left lower leg, subsequent encounter for fracture with routine healing: Secondary | ICD-10-CM | POA: Diagnosis not present

## 2022-09-03 DIAGNOSIS — E785 Hyperlipidemia, unspecified: Secondary | ICD-10-CM | POA: Diagnosis not present

## 2022-09-03 DIAGNOSIS — M5136 Other intervertebral disc degeneration, lumbar region: Secondary | ICD-10-CM | POA: Diagnosis not present

## 2022-09-03 DIAGNOSIS — F411 Generalized anxiety disorder: Secondary | ICD-10-CM | POA: Diagnosis not present

## 2022-09-03 DIAGNOSIS — M48061 Spinal stenosis, lumbar region without neurogenic claudication: Secondary | ICD-10-CM | POA: Diagnosis not present

## 2022-09-03 DIAGNOSIS — I129 Hypertensive chronic kidney disease with stage 1 through stage 4 chronic kidney disease, or unspecified chronic kidney disease: Secondary | ICD-10-CM | POA: Diagnosis not present

## 2022-09-03 DIAGNOSIS — M1990 Unspecified osteoarthritis, unspecified site: Secondary | ICD-10-CM | POA: Diagnosis not present

## 2022-09-03 DIAGNOSIS — Z87440 Personal history of urinary (tract) infections: Secondary | ICD-10-CM | POA: Diagnosis not present

## 2022-09-03 DIAGNOSIS — Z6834 Body mass index (BMI) 34.0-34.9, adult: Secondary | ICD-10-CM | POA: Diagnosis not present

## 2022-09-03 DIAGNOSIS — D631 Anemia in chronic kidney disease: Secondary | ICD-10-CM | POA: Diagnosis not present

## 2022-09-03 DIAGNOSIS — Z7982 Long term (current) use of aspirin: Secondary | ICD-10-CM | POA: Diagnosis not present

## 2022-09-03 DIAGNOSIS — Z79891 Long term (current) use of opiate analgesic: Secondary | ICD-10-CM | POA: Diagnosis not present

## 2022-09-03 DIAGNOSIS — F32A Depression, unspecified: Secondary | ICD-10-CM | POA: Diagnosis not present

## 2022-09-03 DIAGNOSIS — M47816 Spondylosis without myelopathy or radiculopathy, lumbar region: Secondary | ICD-10-CM | POA: Diagnosis not present

## 2022-09-03 DIAGNOSIS — I700 Atherosclerosis of aorta: Secondary | ICD-10-CM | POA: Diagnosis not present

## 2022-09-03 DIAGNOSIS — I2510 Atherosclerotic heart disease of native coronary artery without angina pectoris: Secondary | ICD-10-CM | POA: Diagnosis not present

## 2022-09-03 DIAGNOSIS — N739 Female pelvic inflammatory disease, unspecified: Secondary | ICD-10-CM | POA: Diagnosis not present

## 2022-09-08 DIAGNOSIS — E785 Hyperlipidemia, unspecified: Secondary | ICD-10-CM | POA: Diagnosis not present

## 2022-09-08 DIAGNOSIS — D631 Anemia in chronic kidney disease: Secondary | ICD-10-CM | POA: Diagnosis not present

## 2022-09-08 DIAGNOSIS — M80062D Age-related osteoporosis with current pathological fracture, left lower leg, subsequent encounter for fracture with routine healing: Secondary | ICD-10-CM | POA: Diagnosis not present

## 2022-09-08 DIAGNOSIS — I129 Hypertensive chronic kidney disease with stage 1 through stage 4 chronic kidney disease, or unspecified chronic kidney disease: Secondary | ICD-10-CM | POA: Diagnosis not present

## 2022-09-08 DIAGNOSIS — N1832 Chronic kidney disease, stage 3b: Secondary | ICD-10-CM | POA: Diagnosis not present

## 2022-09-17 DIAGNOSIS — M80062D Age-related osteoporosis with current pathological fracture, left lower leg, subsequent encounter for fracture with routine healing: Secondary | ICD-10-CM | POA: Diagnosis not present

## 2022-09-17 DIAGNOSIS — N1832 Chronic kidney disease, stage 3b: Secondary | ICD-10-CM | POA: Diagnosis not present

## 2022-09-17 DIAGNOSIS — D631 Anemia in chronic kidney disease: Secondary | ICD-10-CM | POA: Diagnosis not present

## 2022-09-17 DIAGNOSIS — I129 Hypertensive chronic kidney disease with stage 1 through stage 4 chronic kidney disease, or unspecified chronic kidney disease: Secondary | ICD-10-CM | POA: Diagnosis not present

## 2022-09-17 DIAGNOSIS — E785 Hyperlipidemia, unspecified: Secondary | ICD-10-CM | POA: Diagnosis not present

## 2022-09-23 DIAGNOSIS — I129 Hypertensive chronic kidney disease with stage 1 through stage 4 chronic kidney disease, or unspecified chronic kidney disease: Secondary | ICD-10-CM | POA: Diagnosis not present

## 2022-09-23 DIAGNOSIS — E785 Hyperlipidemia, unspecified: Secondary | ICD-10-CM | POA: Diagnosis not present

## 2022-09-23 DIAGNOSIS — M80062D Age-related osteoporosis with current pathological fracture, left lower leg, subsequent encounter for fracture with routine healing: Secondary | ICD-10-CM | POA: Diagnosis not present

## 2022-09-23 DIAGNOSIS — N1832 Chronic kidney disease, stage 3b: Secondary | ICD-10-CM | POA: Diagnosis not present

## 2022-09-23 DIAGNOSIS — D631 Anemia in chronic kidney disease: Secondary | ICD-10-CM | POA: Diagnosis not present

## 2022-09-24 DIAGNOSIS — Z79899 Other long term (current) drug therapy: Secondary | ICD-10-CM | POA: Diagnosis not present

## 2022-09-24 DIAGNOSIS — N1832 Chronic kidney disease, stage 3b: Secondary | ICD-10-CM | POA: Diagnosis not present

## 2022-09-24 DIAGNOSIS — D631 Anemia in chronic kidney disease: Secondary | ICD-10-CM | POA: Diagnosis not present

## 2022-09-24 DIAGNOSIS — R7303 Prediabetes: Secondary | ICD-10-CM | POA: Diagnosis not present

## 2022-09-24 DIAGNOSIS — M109 Gout, unspecified: Secondary | ICD-10-CM | POA: Diagnosis not present

## 2022-09-24 DIAGNOSIS — I10 Essential (primary) hypertension: Secondary | ICD-10-CM | POA: Diagnosis not present

## 2022-09-24 DIAGNOSIS — E785 Hyperlipidemia, unspecified: Secondary | ICD-10-CM | POA: Diagnosis not present

## 2022-09-24 DIAGNOSIS — I129 Hypertensive chronic kidney disease with stage 1 through stage 4 chronic kidney disease, or unspecified chronic kidney disease: Secondary | ICD-10-CM | POA: Diagnosis not present

## 2022-09-24 DIAGNOSIS — M80062D Age-related osteoporosis with current pathological fracture, left lower leg, subsequent encounter for fracture with routine healing: Secondary | ICD-10-CM | POA: Diagnosis not present

## 2022-09-29 DIAGNOSIS — N1832 Chronic kidney disease, stage 3b: Secondary | ICD-10-CM | POA: Diagnosis not present

## 2022-09-29 DIAGNOSIS — I129 Hypertensive chronic kidney disease with stage 1 through stage 4 chronic kidney disease, or unspecified chronic kidney disease: Secondary | ICD-10-CM | POA: Diagnosis not present

## 2022-09-29 DIAGNOSIS — M80062D Age-related osteoporosis with current pathological fracture, left lower leg, subsequent encounter for fracture with routine healing: Secondary | ICD-10-CM | POA: Diagnosis not present

## 2022-09-29 DIAGNOSIS — D631 Anemia in chronic kidney disease: Secondary | ICD-10-CM | POA: Diagnosis not present

## 2022-09-29 DIAGNOSIS — E785 Hyperlipidemia, unspecified: Secondary | ICD-10-CM | POA: Diagnosis not present

## 2022-10-03 DIAGNOSIS — Z7982 Long term (current) use of aspirin: Secondary | ICD-10-CM | POA: Diagnosis not present

## 2022-10-03 DIAGNOSIS — Z87440 Personal history of urinary (tract) infections: Secondary | ICD-10-CM | POA: Diagnosis not present

## 2022-10-03 DIAGNOSIS — Z9181 History of falling: Secondary | ICD-10-CM | POA: Diagnosis not present

## 2022-10-03 DIAGNOSIS — M80062D Age-related osteoporosis with current pathological fracture, left lower leg, subsequent encounter for fracture with routine healing: Secondary | ICD-10-CM | POA: Diagnosis not present

## 2022-10-03 DIAGNOSIS — Z79891 Long term (current) use of opiate analgesic: Secondary | ICD-10-CM | POA: Diagnosis not present

## 2022-10-03 DIAGNOSIS — N739 Female pelvic inflammatory disease, unspecified: Secondary | ICD-10-CM | POA: Diagnosis not present

## 2022-10-03 DIAGNOSIS — I700 Atherosclerosis of aorta: Secondary | ICD-10-CM | POA: Diagnosis not present

## 2022-10-03 DIAGNOSIS — I2510 Atherosclerotic heart disease of native coronary artery without angina pectoris: Secondary | ICD-10-CM | POA: Diagnosis not present

## 2022-10-03 DIAGNOSIS — D6851 Activated protein C resistance: Secondary | ICD-10-CM | POA: Diagnosis not present

## 2022-10-03 DIAGNOSIS — D631 Anemia in chronic kidney disease: Secondary | ICD-10-CM | POA: Diagnosis not present

## 2022-10-03 DIAGNOSIS — I6521 Occlusion and stenosis of right carotid artery: Secondary | ICD-10-CM | POA: Diagnosis not present

## 2022-10-03 DIAGNOSIS — M5136 Other intervertebral disc degeneration, lumbar region: Secondary | ICD-10-CM | POA: Diagnosis not present

## 2022-10-03 DIAGNOSIS — N841 Polyp of cervix uteri: Secondary | ICD-10-CM | POA: Diagnosis not present

## 2022-10-03 DIAGNOSIS — M109 Gout, unspecified: Secondary | ICD-10-CM | POA: Diagnosis not present

## 2022-10-03 DIAGNOSIS — F32A Depression, unspecified: Secondary | ICD-10-CM | POA: Diagnosis not present

## 2022-10-03 DIAGNOSIS — M47816 Spondylosis without myelopathy or radiculopathy, lumbar region: Secondary | ICD-10-CM | POA: Diagnosis not present

## 2022-10-03 DIAGNOSIS — M48061 Spinal stenosis, lumbar region without neurogenic claudication: Secondary | ICD-10-CM | POA: Diagnosis not present

## 2022-10-03 DIAGNOSIS — I129 Hypertensive chronic kidney disease with stage 1 through stage 4 chronic kidney disease, or unspecified chronic kidney disease: Secondary | ICD-10-CM | POA: Diagnosis not present

## 2022-10-03 DIAGNOSIS — N1832 Chronic kidney disease, stage 3b: Secondary | ICD-10-CM | POA: Diagnosis not present

## 2022-10-03 DIAGNOSIS — M1990 Unspecified osteoarthritis, unspecified site: Secondary | ICD-10-CM | POA: Diagnosis not present

## 2022-10-03 DIAGNOSIS — Z6834 Body mass index (BMI) 34.0-34.9, adult: Secondary | ICD-10-CM | POA: Diagnosis not present

## 2022-10-03 DIAGNOSIS — E785 Hyperlipidemia, unspecified: Secondary | ICD-10-CM | POA: Diagnosis not present

## 2022-10-03 DIAGNOSIS — H9193 Unspecified hearing loss, bilateral: Secondary | ICD-10-CM | POA: Diagnosis not present

## 2022-10-03 DIAGNOSIS — F411 Generalized anxiety disorder: Secondary | ICD-10-CM | POA: Diagnosis not present

## 2022-10-06 DIAGNOSIS — M25572 Pain in left ankle and joints of left foot: Secondary | ICD-10-CM | POA: Diagnosis not present

## 2022-10-09 DIAGNOSIS — M80062D Age-related osteoporosis with current pathological fracture, left lower leg, subsequent encounter for fracture with routine healing: Secondary | ICD-10-CM | POA: Diagnosis not present

## 2022-10-09 DIAGNOSIS — I129 Hypertensive chronic kidney disease with stage 1 through stage 4 chronic kidney disease, or unspecified chronic kidney disease: Secondary | ICD-10-CM | POA: Diagnosis not present

## 2022-10-09 DIAGNOSIS — E785 Hyperlipidemia, unspecified: Secondary | ICD-10-CM | POA: Diagnosis not present

## 2022-10-09 DIAGNOSIS — N1832 Chronic kidney disease, stage 3b: Secondary | ICD-10-CM | POA: Diagnosis not present

## 2022-10-09 DIAGNOSIS — D631 Anemia in chronic kidney disease: Secondary | ICD-10-CM | POA: Diagnosis not present

## 2022-10-16 DIAGNOSIS — N1832 Chronic kidney disease, stage 3b: Secondary | ICD-10-CM | POA: Diagnosis not present

## 2022-10-16 DIAGNOSIS — I129 Hypertensive chronic kidney disease with stage 1 through stage 4 chronic kidney disease, or unspecified chronic kidney disease: Secondary | ICD-10-CM | POA: Diagnosis not present

## 2022-10-16 DIAGNOSIS — D631 Anemia in chronic kidney disease: Secondary | ICD-10-CM | POA: Diagnosis not present

## 2022-10-16 DIAGNOSIS — M80062D Age-related osteoporosis with current pathological fracture, left lower leg, subsequent encounter for fracture with routine healing: Secondary | ICD-10-CM | POA: Diagnosis not present

## 2022-10-16 DIAGNOSIS — E785 Hyperlipidemia, unspecified: Secondary | ICD-10-CM | POA: Diagnosis not present

## 2022-10-23 DIAGNOSIS — D631 Anemia in chronic kidney disease: Secondary | ICD-10-CM | POA: Diagnosis not present

## 2022-10-23 DIAGNOSIS — I129 Hypertensive chronic kidney disease with stage 1 through stage 4 chronic kidney disease, or unspecified chronic kidney disease: Secondary | ICD-10-CM | POA: Diagnosis not present

## 2022-10-23 DIAGNOSIS — M80062D Age-related osteoporosis with current pathological fracture, left lower leg, subsequent encounter for fracture with routine healing: Secondary | ICD-10-CM | POA: Diagnosis not present

## 2022-10-23 DIAGNOSIS — N1832 Chronic kidney disease, stage 3b: Secondary | ICD-10-CM | POA: Diagnosis not present

## 2022-10-23 DIAGNOSIS — E785 Hyperlipidemia, unspecified: Secondary | ICD-10-CM | POA: Diagnosis not present

## 2022-10-28 DIAGNOSIS — S82202D Unspecified fracture of shaft of left tibia, subsequent encounter for closed fracture with routine healing: Secondary | ICD-10-CM | POA: Diagnosis not present

## 2022-10-28 DIAGNOSIS — M19071 Primary osteoarthritis, right ankle and foot: Secondary | ICD-10-CM | POA: Diagnosis not present

## 2022-10-29 DIAGNOSIS — N1832 Chronic kidney disease, stage 3b: Secondary | ICD-10-CM | POA: Diagnosis not present

## 2022-10-29 DIAGNOSIS — E785 Hyperlipidemia, unspecified: Secondary | ICD-10-CM | POA: Diagnosis not present

## 2022-10-29 DIAGNOSIS — D631 Anemia in chronic kidney disease: Secondary | ICD-10-CM | POA: Diagnosis not present

## 2022-10-29 DIAGNOSIS — I129 Hypertensive chronic kidney disease with stage 1 through stage 4 chronic kidney disease, or unspecified chronic kidney disease: Secondary | ICD-10-CM | POA: Diagnosis not present

## 2022-10-29 DIAGNOSIS — M80062D Age-related osteoporosis with current pathological fracture, left lower leg, subsequent encounter for fracture with routine healing: Secondary | ICD-10-CM | POA: Diagnosis not present

## 2022-11-03 DIAGNOSIS — I10 Essential (primary) hypertension: Secondary | ICD-10-CM | POA: Diagnosis not present

## 2022-11-03 DIAGNOSIS — M109 Gout, unspecified: Secondary | ICD-10-CM | POA: Diagnosis not present

## 2022-11-03 DIAGNOSIS — F419 Anxiety disorder, unspecified: Secondary | ICD-10-CM | POA: Diagnosis not present

## 2022-11-03 DIAGNOSIS — Z6832 Body mass index (BMI) 32.0-32.9, adult: Secondary | ICD-10-CM | POA: Diagnosis not present

## 2022-11-03 DIAGNOSIS — N1832 Chronic kidney disease, stage 3b: Secondary | ICD-10-CM | POA: Diagnosis not present

## 2022-11-18 DIAGNOSIS — M25672 Stiffness of left ankle, not elsewhere classified: Secondary | ICD-10-CM | POA: Diagnosis not present

## 2022-11-18 DIAGNOSIS — M25472 Effusion, left ankle: Secondary | ICD-10-CM | POA: Diagnosis not present

## 2022-11-18 DIAGNOSIS — M25572 Pain in left ankle and joints of left foot: Secondary | ICD-10-CM | POA: Diagnosis not present

## 2022-12-08 DIAGNOSIS — S82202D Unspecified fracture of shaft of left tibia, subsequent encounter for closed fracture with routine healing: Secondary | ICD-10-CM | POA: Diagnosis not present

## 2022-12-15 DIAGNOSIS — M109 Gout, unspecified: Secondary | ICD-10-CM | POA: Diagnosis not present

## 2022-12-15 DIAGNOSIS — Z79899 Other long term (current) drug therapy: Secondary | ICD-10-CM | POA: Diagnosis not present

## 2022-12-28 DIAGNOSIS — E785 Hyperlipidemia, unspecified: Secondary | ICD-10-CM | POA: Diagnosis not present

## 2022-12-28 DIAGNOSIS — N1831 Chronic kidney disease, stage 3a: Secondary | ICD-10-CM | POA: Diagnosis not present

## 2022-12-28 DIAGNOSIS — M109 Gout, unspecified: Secondary | ICD-10-CM | POA: Diagnosis not present

## 2022-12-28 DIAGNOSIS — Z6832 Body mass index (BMI) 32.0-32.9, adult: Secondary | ICD-10-CM | POA: Diagnosis not present

## 2022-12-28 DIAGNOSIS — I10 Essential (primary) hypertension: Secondary | ICD-10-CM | POA: Diagnosis not present

## 2022-12-28 DIAGNOSIS — F419 Anxiety disorder, unspecified: Secondary | ICD-10-CM | POA: Diagnosis not present

## 2023-01-01 DIAGNOSIS — I129 Hypertensive chronic kidney disease with stage 1 through stage 4 chronic kidney disease, or unspecified chronic kidney disease: Secondary | ICD-10-CM | POA: Diagnosis not present

## 2023-01-01 DIAGNOSIS — M109 Gout, unspecified: Secondary | ICD-10-CM | POA: Diagnosis not present

## 2023-01-01 DIAGNOSIS — E785 Hyperlipidemia, unspecified: Secondary | ICD-10-CM | POA: Diagnosis not present

## 2023-01-01 DIAGNOSIS — R7303 Prediabetes: Secondary | ICD-10-CM | POA: Diagnosis not present

## 2023-01-01 DIAGNOSIS — N1832 Chronic kidney disease, stage 3b: Secondary | ICD-10-CM | POA: Diagnosis not present

## 2023-01-01 DIAGNOSIS — G8929 Other chronic pain: Secondary | ICD-10-CM | POA: Diagnosis not present

## 2023-01-01 DIAGNOSIS — N390 Urinary tract infection, site not specified: Secondary | ICD-10-CM | POA: Diagnosis not present

## 2023-01-05 DIAGNOSIS — N1832 Chronic kidney disease, stage 3b: Secondary | ICD-10-CM | POA: Diagnosis not present

## 2023-01-05 DIAGNOSIS — N281 Cyst of kidney, acquired: Secondary | ICD-10-CM | POA: Diagnosis not present

## 2023-01-05 DIAGNOSIS — N189 Chronic kidney disease, unspecified: Secondary | ICD-10-CM | POA: Diagnosis not present

## 2023-01-06 DIAGNOSIS — D6859 Other primary thrombophilia: Secondary | ICD-10-CM | POA: Diagnosis not present

## 2023-01-06 DIAGNOSIS — D6852 Prothrombin gene mutation: Secondary | ICD-10-CM | POA: Diagnosis not present

## 2023-01-25 DIAGNOSIS — M19011 Primary osteoarthritis, right shoulder: Secondary | ICD-10-CM | POA: Diagnosis not present

## 2023-02-10 DIAGNOSIS — Z79899 Other long term (current) drug therapy: Secondary | ICD-10-CM | POA: Diagnosis not present

## 2023-02-10 DIAGNOSIS — M79609 Pain in unspecified limb: Secondary | ICD-10-CM | POA: Diagnosis not present

## 2023-02-10 DIAGNOSIS — R52 Pain, unspecified: Secondary | ICD-10-CM | POA: Diagnosis not present

## 2023-02-18 DIAGNOSIS — I2510 Atherosclerotic heart disease of native coronary artery without angina pectoris: Secondary | ICD-10-CM | POA: Diagnosis not present

## 2023-02-18 DIAGNOSIS — Z7982 Long term (current) use of aspirin: Secondary | ICD-10-CM | POA: Diagnosis not present

## 2023-02-18 DIAGNOSIS — E1122 Type 2 diabetes mellitus with diabetic chronic kidney disease: Secondary | ICD-10-CM | POA: Diagnosis not present

## 2023-02-18 DIAGNOSIS — K5909 Other constipation: Secondary | ICD-10-CM | POA: Diagnosis not present

## 2023-02-18 DIAGNOSIS — I10 Essential (primary) hypertension: Secondary | ICD-10-CM | POA: Diagnosis not present

## 2023-02-18 DIAGNOSIS — E785 Hyperlipidemia, unspecified: Secondary | ICD-10-CM | POA: Diagnosis not present

## 2023-02-18 DIAGNOSIS — F32A Depression, unspecified: Secondary | ICD-10-CM | POA: Diagnosis not present

## 2023-02-18 DIAGNOSIS — H903 Sensorineural hearing loss, bilateral: Secondary | ICD-10-CM | POA: Diagnosis not present

## 2023-02-18 DIAGNOSIS — Z79899 Other long term (current) drug therapy: Secondary | ICD-10-CM | POA: Diagnosis not present

## 2023-02-18 DIAGNOSIS — M19011 Primary osteoarthritis, right shoulder: Secondary | ICD-10-CM | POA: Diagnosis not present

## 2023-02-18 DIAGNOSIS — G8918 Other acute postprocedural pain: Secondary | ICD-10-CM | POA: Diagnosis not present

## 2023-02-18 DIAGNOSIS — M109 Gout, unspecified: Secondary | ICD-10-CM | POA: Diagnosis not present

## 2023-02-18 DIAGNOSIS — M12811 Other specific arthropathies, not elsewhere classified, right shoulder: Secondary | ICD-10-CM | POA: Diagnosis not present

## 2023-02-18 DIAGNOSIS — F419 Anxiety disorder, unspecified: Secondary | ICD-10-CM | POA: Diagnosis not present

## 2023-02-18 DIAGNOSIS — I129 Hypertensive chronic kidney disease with stage 1 through stage 4 chronic kidney disease, or unspecified chronic kidney disease: Secondary | ICD-10-CM | POA: Diagnosis not present

## 2023-02-18 DIAGNOSIS — K219 Gastro-esophageal reflux disease without esophagitis: Secondary | ICD-10-CM | POA: Diagnosis not present

## 2023-02-18 DIAGNOSIS — N1832 Chronic kidney disease, stage 3b: Secondary | ICD-10-CM | POA: Diagnosis not present

## 2023-02-18 DIAGNOSIS — Z6836 Body mass index (BMI) 36.0-36.9, adult: Secondary | ICD-10-CM | POA: Diagnosis not present

## 2023-02-18 DIAGNOSIS — D6851 Activated protein C resistance: Secondary | ICD-10-CM | POA: Diagnosis not present

## 2023-02-18 DIAGNOSIS — E669 Obesity, unspecified: Secondary | ICD-10-CM | POA: Diagnosis not present

## 2023-03-02 DIAGNOSIS — M25611 Stiffness of right shoulder, not elsewhere classified: Secondary | ICD-10-CM | POA: Diagnosis not present

## 2023-03-02 DIAGNOSIS — M25511 Pain in right shoulder: Secondary | ICD-10-CM | POA: Diagnosis not present

## 2023-03-08 DIAGNOSIS — M25511 Pain in right shoulder: Secondary | ICD-10-CM | POA: Diagnosis not present

## 2023-03-08 DIAGNOSIS — M25611 Stiffness of right shoulder, not elsewhere classified: Secondary | ICD-10-CM | POA: Diagnosis not present

## 2023-03-15 DIAGNOSIS — M79672 Pain in left foot: Secondary | ICD-10-CM | POA: Diagnosis not present

## 2023-03-15 DIAGNOSIS — M2011 Hallux valgus (acquired), right foot: Secondary | ICD-10-CM | POA: Diagnosis not present

## 2023-03-15 DIAGNOSIS — M25611 Stiffness of right shoulder, not elsewhere classified: Secondary | ICD-10-CM | POA: Diagnosis not present

## 2023-03-15 DIAGNOSIS — M25511 Pain in right shoulder: Secondary | ICD-10-CM | POA: Diagnosis not present

## 2023-03-15 DIAGNOSIS — M2012 Hallux valgus (acquired), left foot: Secondary | ICD-10-CM | POA: Diagnosis not present

## 2023-03-15 DIAGNOSIS — M79671 Pain in right foot: Secondary | ICD-10-CM | POA: Diagnosis not present

## 2023-03-17 DIAGNOSIS — M25511 Pain in right shoulder: Secondary | ICD-10-CM | POA: Diagnosis not present

## 2023-03-17 DIAGNOSIS — M25611 Stiffness of right shoulder, not elsewhere classified: Secondary | ICD-10-CM | POA: Diagnosis not present

## 2023-03-18 DIAGNOSIS — H2513 Age-related nuclear cataract, bilateral: Secondary | ICD-10-CM | POA: Diagnosis not present

## 2023-03-18 DIAGNOSIS — H25813 Combined forms of age-related cataract, bilateral: Secondary | ICD-10-CM | POA: Diagnosis not present

## 2023-03-18 DIAGNOSIS — H25013 Cortical age-related cataract, bilateral: Secondary | ICD-10-CM | POA: Diagnosis not present

## 2023-03-18 DIAGNOSIS — I10 Essential (primary) hypertension: Secondary | ICD-10-CM | POA: Diagnosis not present

## 2023-03-22 DIAGNOSIS — M25611 Stiffness of right shoulder, not elsewhere classified: Secondary | ICD-10-CM | POA: Diagnosis not present

## 2023-03-22 DIAGNOSIS — M25511 Pain in right shoulder: Secondary | ICD-10-CM | POA: Diagnosis not present

## 2023-03-24 DIAGNOSIS — M25511 Pain in right shoulder: Secondary | ICD-10-CM | POA: Diagnosis not present

## 2023-03-24 DIAGNOSIS — M25611 Stiffness of right shoulder, not elsewhere classified: Secondary | ICD-10-CM | POA: Diagnosis not present

## 2023-03-29 DIAGNOSIS — M25611 Stiffness of right shoulder, not elsewhere classified: Secondary | ICD-10-CM | POA: Diagnosis not present

## 2023-03-29 DIAGNOSIS — M25511 Pain in right shoulder: Secondary | ICD-10-CM | POA: Diagnosis not present

## 2023-04-02 DIAGNOSIS — M25611 Stiffness of right shoulder, not elsewhere classified: Secondary | ICD-10-CM | POA: Diagnosis not present

## 2023-04-02 DIAGNOSIS — M25511 Pain in right shoulder: Secondary | ICD-10-CM | POA: Diagnosis not present

## 2023-04-07 DIAGNOSIS — M25511 Pain in right shoulder: Secondary | ICD-10-CM | POA: Diagnosis not present

## 2023-04-07 DIAGNOSIS — M25611 Stiffness of right shoulder, not elsewhere classified: Secondary | ICD-10-CM | POA: Diagnosis not present

## 2023-04-07 DIAGNOSIS — M19011 Primary osteoarthritis, right shoulder: Secondary | ICD-10-CM | POA: Diagnosis not present

## 2023-04-09 DIAGNOSIS — M25511 Pain in right shoulder: Secondary | ICD-10-CM | POA: Diagnosis not present

## 2023-04-09 DIAGNOSIS — M25611 Stiffness of right shoulder, not elsewhere classified: Secondary | ICD-10-CM | POA: Diagnosis not present

## 2023-04-13 DIAGNOSIS — Z79899 Other long term (current) drug therapy: Secondary | ICD-10-CM | POA: Diagnosis not present

## 2023-04-13 DIAGNOSIS — Z1331 Encounter for screening for depression: Secondary | ICD-10-CM | POA: Diagnosis not present

## 2023-04-13 DIAGNOSIS — M109 Gout, unspecified: Secondary | ICD-10-CM | POA: Diagnosis not present

## 2023-04-13 DIAGNOSIS — M810 Age-related osteoporosis without current pathological fracture: Secondary | ICD-10-CM | POA: Diagnosis not present

## 2023-04-13 DIAGNOSIS — E2839 Other primary ovarian failure: Secondary | ICD-10-CM | POA: Diagnosis not present

## 2023-04-13 DIAGNOSIS — E785 Hyperlipidemia, unspecified: Secondary | ICD-10-CM | POA: Diagnosis not present

## 2023-04-13 DIAGNOSIS — N1832 Chronic kidney disease, stage 3b: Secondary | ICD-10-CM | POA: Diagnosis not present

## 2023-04-13 DIAGNOSIS — Z1231 Encounter for screening mammogram for malignant neoplasm of breast: Secondary | ICD-10-CM | POA: Diagnosis not present

## 2023-04-13 DIAGNOSIS — I10 Essential (primary) hypertension: Secondary | ICD-10-CM | POA: Diagnosis not present

## 2023-04-13 DIAGNOSIS — Z0000 Encounter for general adult medical examination without abnormal findings: Secondary | ICD-10-CM | POA: Diagnosis not present

## 2023-04-13 DIAGNOSIS — R7303 Prediabetes: Secondary | ICD-10-CM | POA: Diagnosis not present

## 2023-04-14 DIAGNOSIS — M25611 Stiffness of right shoulder, not elsewhere classified: Secondary | ICD-10-CM | POA: Diagnosis not present

## 2023-04-14 DIAGNOSIS — M25511 Pain in right shoulder: Secondary | ICD-10-CM | POA: Diagnosis not present

## 2023-04-21 DIAGNOSIS — M25611 Stiffness of right shoulder, not elsewhere classified: Secondary | ICD-10-CM | POA: Diagnosis not present

## 2023-04-21 DIAGNOSIS — M25511 Pain in right shoulder: Secondary | ICD-10-CM | POA: Diagnosis not present

## 2023-04-26 DIAGNOSIS — M25511 Pain in right shoulder: Secondary | ICD-10-CM | POA: Diagnosis not present

## 2023-04-26 DIAGNOSIS — M810 Age-related osteoporosis without current pathological fracture: Secondary | ICD-10-CM | POA: Diagnosis not present

## 2023-04-26 DIAGNOSIS — M25611 Stiffness of right shoulder, not elsewhere classified: Secondary | ICD-10-CM | POA: Diagnosis not present

## 2023-04-29 DIAGNOSIS — M25611 Stiffness of right shoulder, not elsewhere classified: Secondary | ICD-10-CM | POA: Diagnosis not present

## 2023-04-29 DIAGNOSIS — M25511 Pain in right shoulder: Secondary | ICD-10-CM | POA: Diagnosis not present

## 2023-05-03 DIAGNOSIS — M25511 Pain in right shoulder: Secondary | ICD-10-CM | POA: Diagnosis not present

## 2023-05-03 DIAGNOSIS — M25611 Stiffness of right shoulder, not elsewhere classified: Secondary | ICD-10-CM | POA: Diagnosis not present

## 2023-05-05 DIAGNOSIS — R7303 Prediabetes: Secondary | ICD-10-CM | POA: Diagnosis not present

## 2023-05-05 DIAGNOSIS — G8929 Other chronic pain: Secondary | ICD-10-CM | POA: Diagnosis not present

## 2023-05-05 DIAGNOSIS — N1832 Chronic kidney disease, stage 3b: Secondary | ICD-10-CM | POA: Diagnosis not present

## 2023-05-05 DIAGNOSIS — I129 Hypertensive chronic kidney disease with stage 1 through stage 4 chronic kidney disease, or unspecified chronic kidney disease: Secondary | ICD-10-CM | POA: Diagnosis not present

## 2023-05-05 DIAGNOSIS — N1831 Chronic kidney disease, stage 3a: Secondary | ICD-10-CM | POA: Diagnosis not present

## 2023-05-05 DIAGNOSIS — E785 Hyperlipidemia, unspecified: Secondary | ICD-10-CM | POA: Diagnosis not present

## 2023-05-05 DIAGNOSIS — M109 Gout, unspecified: Secondary | ICD-10-CM | POA: Diagnosis not present

## 2023-05-06 DIAGNOSIS — M25611 Stiffness of right shoulder, not elsewhere classified: Secondary | ICD-10-CM | POA: Diagnosis not present

## 2023-05-06 DIAGNOSIS — M25511 Pain in right shoulder: Secondary | ICD-10-CM | POA: Diagnosis not present

## 2023-05-10 DIAGNOSIS — M25511 Pain in right shoulder: Secondary | ICD-10-CM | POA: Diagnosis not present

## 2023-05-10 DIAGNOSIS — M25611 Stiffness of right shoulder, not elsewhere classified: Secondary | ICD-10-CM | POA: Diagnosis not present

## 2023-05-13 DIAGNOSIS — M25611 Stiffness of right shoulder, not elsewhere classified: Secondary | ICD-10-CM | POA: Diagnosis not present

## 2023-05-13 DIAGNOSIS — M25511 Pain in right shoulder: Secondary | ICD-10-CM | POA: Diagnosis not present

## 2023-05-19 DIAGNOSIS — H40013 Open angle with borderline findings, low risk, bilateral: Secondary | ICD-10-CM | POA: Diagnosis not present

## 2023-05-19 DIAGNOSIS — H2513 Age-related nuclear cataract, bilateral: Secondary | ICD-10-CM | POA: Diagnosis not present

## 2023-05-19 DIAGNOSIS — H2512 Age-related nuclear cataract, left eye: Secondary | ICD-10-CM | POA: Diagnosis not present

## 2023-05-19 DIAGNOSIS — I10 Essential (primary) hypertension: Secondary | ICD-10-CM | POA: Diagnosis not present

## 2023-05-21 DIAGNOSIS — M25511 Pain in right shoulder: Secondary | ICD-10-CM | POA: Diagnosis not present

## 2023-05-21 DIAGNOSIS — M25611 Stiffness of right shoulder, not elsewhere classified: Secondary | ICD-10-CM | POA: Diagnosis not present

## 2023-06-07 DIAGNOSIS — H6691 Otitis media, unspecified, right ear: Secondary | ICD-10-CM | POA: Diagnosis not present

## 2023-06-07 DIAGNOSIS — Z6834 Body mass index (BMI) 34.0-34.9, adult: Secondary | ICD-10-CM | POA: Diagnosis not present

## 2023-07-02 DIAGNOSIS — M810 Age-related osteoporosis without current pathological fracture: Secondary | ICD-10-CM | POA: Diagnosis not present

## 2023-07-02 DIAGNOSIS — E2839 Other primary ovarian failure: Secondary | ICD-10-CM | POA: Diagnosis not present

## 2023-07-02 DIAGNOSIS — Z1231 Encounter for screening mammogram for malignant neoplasm of breast: Secondary | ICD-10-CM | POA: Diagnosis not present

## 2023-07-05 DIAGNOSIS — Z13820 Encounter for screening for osteoporosis: Secondary | ICD-10-CM | POA: Diagnosis not present

## 2023-07-05 DIAGNOSIS — M810 Age-related osteoporosis without current pathological fracture: Secondary | ICD-10-CM | POA: Diagnosis not present

## 2023-07-29 DIAGNOSIS — H2512 Age-related nuclear cataract, left eye: Secondary | ICD-10-CM | POA: Diagnosis not present

## 2023-07-29 DIAGNOSIS — H25812 Combined forms of age-related cataract, left eye: Secondary | ICD-10-CM | POA: Diagnosis not present

## 2023-07-30 DIAGNOSIS — H25011 Cortical age-related cataract, right eye: Secondary | ICD-10-CM | POA: Diagnosis not present

## 2023-07-30 DIAGNOSIS — H25041 Posterior subcapsular polar age-related cataract, right eye: Secondary | ICD-10-CM | POA: Diagnosis not present

## 2023-07-30 DIAGNOSIS — H2511 Age-related nuclear cataract, right eye: Secondary | ICD-10-CM | POA: Diagnosis not present

## 2023-08-19 DIAGNOSIS — H2511 Age-related nuclear cataract, right eye: Secondary | ICD-10-CM | POA: Diagnosis not present

## 2023-08-19 DIAGNOSIS — H25811 Combined forms of age-related cataract, right eye: Secondary | ICD-10-CM | POA: Diagnosis not present

## 2023-08-24 DIAGNOSIS — M19011 Primary osteoarthritis, right shoulder: Secondary | ICD-10-CM | POA: Diagnosis not present

## 2023-08-25 DIAGNOSIS — N1831 Chronic kidney disease, stage 3a: Secondary | ICD-10-CM | POA: Diagnosis not present

## 2023-08-25 DIAGNOSIS — Z01818 Encounter for other preprocedural examination: Secondary | ICD-10-CM | POA: Diagnosis not present

## 2023-08-25 DIAGNOSIS — M810 Age-related osteoporosis without current pathological fracture: Secondary | ICD-10-CM | POA: Diagnosis not present

## 2023-08-25 DIAGNOSIS — E785 Hyperlipidemia, unspecified: Secondary | ICD-10-CM | POA: Diagnosis not present

## 2023-08-25 DIAGNOSIS — L858 Other specified epidermal thickening: Secondary | ICD-10-CM | POA: Diagnosis not present

## 2023-08-25 DIAGNOSIS — Z6833 Body mass index (BMI) 33.0-33.9, adult: Secondary | ICD-10-CM | POA: Diagnosis not present

## 2023-08-25 DIAGNOSIS — I10 Essential (primary) hypertension: Secondary | ICD-10-CM | POA: Diagnosis not present

## 2023-08-25 DIAGNOSIS — Z79899 Other long term (current) drug therapy: Secondary | ICD-10-CM | POA: Diagnosis not present

## 2023-09-03 DIAGNOSIS — Z79899 Other long term (current) drug therapy: Secondary | ICD-10-CM | POA: Diagnosis not present

## 2023-09-13 DIAGNOSIS — E559 Vitamin D deficiency, unspecified: Secondary | ICD-10-CM | POA: Diagnosis not present

## 2023-09-13 DIAGNOSIS — M79609 Pain in unspecified limb: Secondary | ICD-10-CM | POA: Diagnosis not present

## 2023-09-13 DIAGNOSIS — Z79899 Other long term (current) drug therapy: Secondary | ICD-10-CM | POA: Diagnosis not present

## 2023-09-13 DIAGNOSIS — Z01818 Encounter for other preprocedural examination: Secondary | ICD-10-CM | POA: Diagnosis not present

## 2023-09-27 DIAGNOSIS — M19012 Primary osteoarthritis, left shoulder: Secondary | ICD-10-CM | POA: Diagnosis not present

## 2023-09-28 DIAGNOSIS — M19012 Primary osteoarthritis, left shoulder: Secondary | ICD-10-CM | POA: Diagnosis not present

## 2023-09-30 DIAGNOSIS — Z96612 Presence of left artificial shoulder joint: Secondary | ICD-10-CM | POA: Diagnosis not present

## 2023-09-30 DIAGNOSIS — F32A Depression, unspecified: Secondary | ICD-10-CM | POA: Diagnosis not present

## 2023-09-30 DIAGNOSIS — M19012 Primary osteoarthritis, left shoulder: Secondary | ICD-10-CM | POA: Diagnosis not present

## 2023-09-30 DIAGNOSIS — Z880 Allergy status to penicillin: Secondary | ICD-10-CM | POA: Diagnosis not present

## 2023-09-30 DIAGNOSIS — G8918 Other acute postprocedural pain: Secondary | ICD-10-CM | POA: Diagnosis not present

## 2023-09-30 DIAGNOSIS — Z87440 Personal history of urinary (tract) infections: Secondary | ICD-10-CM | POA: Diagnosis not present

## 2023-09-30 DIAGNOSIS — I4581 Long QT syndrome: Secondary | ICD-10-CM | POA: Diagnosis not present

## 2023-09-30 DIAGNOSIS — M7989 Other specified soft tissue disorders: Secondary | ICD-10-CM | POA: Diagnosis not present

## 2023-09-30 DIAGNOSIS — E7800 Pure hypercholesterolemia, unspecified: Secondary | ICD-10-CM | POA: Diagnosis not present

## 2023-09-30 DIAGNOSIS — I10 Essential (primary) hypertension: Secondary | ICD-10-CM | POA: Diagnosis not present

## 2023-09-30 DIAGNOSIS — I2510 Atherosclerotic heart disease of native coronary artery without angina pectoris: Secondary | ICD-10-CM | POA: Diagnosis not present

## 2023-09-30 DIAGNOSIS — Z79899 Other long term (current) drug therapy: Secondary | ICD-10-CM | POA: Diagnosis not present

## 2023-09-30 DIAGNOSIS — F419 Anxiety disorder, unspecified: Secondary | ICD-10-CM | POA: Diagnosis not present

## 2023-09-30 DIAGNOSIS — M1990 Unspecified osteoarthritis, unspecified site: Secondary | ICD-10-CM | POA: Diagnosis not present

## 2023-09-30 DIAGNOSIS — Z7982 Long term (current) use of aspirin: Secondary | ICD-10-CM | POA: Diagnosis not present

## 2023-09-30 DIAGNOSIS — R011 Cardiac murmur, unspecified: Secondary | ICD-10-CM | POA: Diagnosis not present

## 2023-09-30 DIAGNOSIS — Z888 Allergy status to other drugs, medicaments and biological substances status: Secondary | ICD-10-CM | POA: Diagnosis not present

## 2023-10-01 DIAGNOSIS — Z880 Allergy status to penicillin: Secondary | ICD-10-CM | POA: Diagnosis not present

## 2023-10-01 DIAGNOSIS — Z888 Allergy status to other drugs, medicaments and biological substances status: Secondary | ICD-10-CM | POA: Diagnosis not present

## 2023-10-01 DIAGNOSIS — R011 Cardiac murmur, unspecified: Secondary | ICD-10-CM | POA: Diagnosis not present

## 2023-10-01 DIAGNOSIS — Z7982 Long term (current) use of aspirin: Secondary | ICD-10-CM | POA: Diagnosis not present

## 2023-10-01 DIAGNOSIS — Z79899 Other long term (current) drug therapy: Secondary | ICD-10-CM | POA: Diagnosis not present

## 2023-10-01 DIAGNOSIS — M19012 Primary osteoarthritis, left shoulder: Secondary | ICD-10-CM | POA: Diagnosis not present

## 2023-10-07 DIAGNOSIS — M25512 Pain in left shoulder: Secondary | ICD-10-CM | POA: Diagnosis not present

## 2023-10-07 DIAGNOSIS — M25612 Stiffness of left shoulder, not elsewhere classified: Secondary | ICD-10-CM | POA: Diagnosis not present

## 2023-10-13 DIAGNOSIS — M25612 Stiffness of left shoulder, not elsewhere classified: Secondary | ICD-10-CM | POA: Diagnosis not present

## 2023-10-13 DIAGNOSIS — M25512 Pain in left shoulder: Secondary | ICD-10-CM | POA: Diagnosis not present

## 2023-10-18 DIAGNOSIS — M25512 Pain in left shoulder: Secondary | ICD-10-CM | POA: Diagnosis not present

## 2023-10-18 DIAGNOSIS — M25612 Stiffness of left shoulder, not elsewhere classified: Secondary | ICD-10-CM | POA: Diagnosis not present

## 2023-10-20 DIAGNOSIS — M25612 Stiffness of left shoulder, not elsewhere classified: Secondary | ICD-10-CM | POA: Diagnosis not present

## 2023-10-20 DIAGNOSIS — M25512 Pain in left shoulder: Secondary | ICD-10-CM | POA: Diagnosis not present

## 2023-10-25 DIAGNOSIS — M25612 Stiffness of left shoulder, not elsewhere classified: Secondary | ICD-10-CM | POA: Diagnosis not present

## 2023-10-25 DIAGNOSIS — M25512 Pain in left shoulder: Secondary | ICD-10-CM | POA: Diagnosis not present

## 2023-10-27 DIAGNOSIS — M25512 Pain in left shoulder: Secondary | ICD-10-CM | POA: Diagnosis not present

## 2023-10-27 DIAGNOSIS — M25612 Stiffness of left shoulder, not elsewhere classified: Secondary | ICD-10-CM | POA: Diagnosis not present

## 2023-10-28 DIAGNOSIS — Z1211 Encounter for screening for malignant neoplasm of colon: Secondary | ICD-10-CM | POA: Diagnosis not present

## 2023-10-28 DIAGNOSIS — Z1212 Encounter for screening for malignant neoplasm of rectum: Secondary | ICD-10-CM | POA: Diagnosis not present

## 2023-11-03 DIAGNOSIS — M25512 Pain in left shoulder: Secondary | ICD-10-CM | POA: Diagnosis not present

## 2023-11-03 DIAGNOSIS — M25612 Stiffness of left shoulder, not elsewhere classified: Secondary | ICD-10-CM | POA: Diagnosis not present

## 2023-11-09 DIAGNOSIS — M19012 Primary osteoarthritis, left shoulder: Secondary | ICD-10-CM | POA: Diagnosis not present

## 2023-11-09 DIAGNOSIS — M25612 Stiffness of left shoulder, not elsewhere classified: Secondary | ICD-10-CM | POA: Diagnosis not present

## 2023-11-09 DIAGNOSIS — M25512 Pain in left shoulder: Secondary | ICD-10-CM | POA: Diagnosis not present

## 2023-11-18 DIAGNOSIS — I10 Essential (primary) hypertension: Secondary | ICD-10-CM | POA: Diagnosis not present

## 2023-11-18 DIAGNOSIS — Z6833 Body mass index (BMI) 33.0-33.9, adult: Secondary | ICD-10-CM | POA: Diagnosis not present

## 2023-11-24 DIAGNOSIS — Z8616 Personal history of COVID-19: Secondary | ICD-10-CM | POA: Diagnosis not present

## 2023-11-24 DIAGNOSIS — R5383 Other fatigue: Secondary | ICD-10-CM | POA: Diagnosis not present

## 2023-11-24 DIAGNOSIS — G4733 Obstructive sleep apnea (adult) (pediatric): Secondary | ICD-10-CM | POA: Diagnosis not present

## 2023-11-30 DIAGNOSIS — M25512 Pain in left shoulder: Secondary | ICD-10-CM | POA: Diagnosis not present

## 2023-11-30 DIAGNOSIS — M25612 Stiffness of left shoulder, not elsewhere classified: Secondary | ICD-10-CM | POA: Diagnosis not present

## 2023-12-09 DIAGNOSIS — M25612 Stiffness of left shoulder, not elsewhere classified: Secondary | ICD-10-CM | POA: Diagnosis not present

## 2023-12-09 DIAGNOSIS — M25512 Pain in left shoulder: Secondary | ICD-10-CM | POA: Diagnosis not present

## 2023-12-21 DIAGNOSIS — Z79899 Other long term (current) drug therapy: Secondary | ICD-10-CM | POA: Diagnosis not present

## 2023-12-21 DIAGNOSIS — M810 Age-related osteoporosis without current pathological fracture: Secondary | ICD-10-CM | POA: Diagnosis not present

## 2023-12-21 DIAGNOSIS — M109 Gout, unspecified: Secondary | ICD-10-CM | POA: Diagnosis not present

## 2023-12-21 DIAGNOSIS — M5430 Sciatica, unspecified side: Secondary | ICD-10-CM | POA: Diagnosis not present

## 2023-12-21 DIAGNOSIS — N1832 Chronic kidney disease, stage 3b: Secondary | ICD-10-CM | POA: Diagnosis not present

## 2023-12-21 DIAGNOSIS — I10 Essential (primary) hypertension: Secondary | ICD-10-CM | POA: Diagnosis not present

## 2023-12-21 DIAGNOSIS — Z6832 Body mass index (BMI) 32.0-32.9, adult: Secondary | ICD-10-CM | POA: Diagnosis not present

## 2023-12-21 DIAGNOSIS — R519 Headache, unspecified: Secondary | ICD-10-CM | POA: Diagnosis not present

## 2023-12-21 DIAGNOSIS — R7303 Prediabetes: Secondary | ICD-10-CM | POA: Diagnosis not present

## 2023-12-21 DIAGNOSIS — E785 Hyperlipidemia, unspecified: Secondary | ICD-10-CM | POA: Diagnosis not present

## 2023-12-22 DIAGNOSIS — M19012 Primary osteoarthritis, left shoulder: Secondary | ICD-10-CM | POA: Diagnosis not present

## 2023-12-28 DIAGNOSIS — G4733 Obstructive sleep apnea (adult) (pediatric): Secondary | ICD-10-CM | POA: Diagnosis not present

## 2023-12-30 DIAGNOSIS — M818 Other osteoporosis without current pathological fracture: Secondary | ICD-10-CM | POA: Diagnosis not present

## 2024-01-03 DIAGNOSIS — R519 Headache, unspecified: Secondary | ICD-10-CM | POA: Diagnosis not present

## 2024-01-04 DIAGNOSIS — Z8616 Personal history of COVID-19: Secondary | ICD-10-CM | POA: Diagnosis not present

## 2024-01-04 DIAGNOSIS — J329 Chronic sinusitis, unspecified: Secondary | ICD-10-CM | POA: Diagnosis not present

## 2024-01-04 DIAGNOSIS — G4733 Obstructive sleep apnea (adult) (pediatric): Secondary | ICD-10-CM | POA: Diagnosis not present

## 2024-01-04 DIAGNOSIS — R5383 Other fatigue: Secondary | ICD-10-CM | POA: Diagnosis not present

## 2024-01-07 DIAGNOSIS — Z23 Encounter for immunization: Secondary | ICD-10-CM | POA: Diagnosis not present

## 2024-01-26 DIAGNOSIS — N1831 Chronic kidney disease, stage 3a: Secondary | ICD-10-CM | POA: Diagnosis not present

## 2024-01-26 DIAGNOSIS — E785 Hyperlipidemia, unspecified: Secondary | ICD-10-CM | POA: Diagnosis not present

## 2024-01-26 DIAGNOSIS — G4733 Obstructive sleep apnea (adult) (pediatric): Secondary | ICD-10-CM | POA: Diagnosis not present

## 2024-01-26 DIAGNOSIS — R7303 Prediabetes: Secondary | ICD-10-CM | POA: Diagnosis not present

## 2024-01-26 DIAGNOSIS — I129 Hypertensive chronic kidney disease with stage 1 through stage 4 chronic kidney disease, or unspecified chronic kidney disease: Secondary | ICD-10-CM | POA: Diagnosis not present

## 2024-01-26 DIAGNOSIS — G8929 Other chronic pain: Secondary | ICD-10-CM | POA: Diagnosis not present

## 2024-01-26 DIAGNOSIS — M109 Gout, unspecified: Secondary | ICD-10-CM | POA: Diagnosis not present
